# Patient Record
Sex: Female | Born: 1950 | Race: White | Hispanic: No | Smoking: Former smoker
Health system: Southern US, Community
[De-identification: ages and names within clinical notes are randomized; demographics above are authoritative.]

## PROBLEM LIST (undated history)

## (undated) DIAGNOSIS — E119 Type 2 diabetes mellitus without complications: Secondary | ICD-10-CM

## (undated) DIAGNOSIS — M545 Low back pain, unspecified: Secondary | ICD-10-CM

## (undated) DIAGNOSIS — R7303 Prediabetes: Secondary | ICD-10-CM

## (undated) DIAGNOSIS — K219 Gastro-esophageal reflux disease without esophagitis: Secondary | ICD-10-CM

## (undated) DIAGNOSIS — J439 Emphysema, unspecified: Secondary | ICD-10-CM

## (undated) DIAGNOSIS — J189 Pneumonia, unspecified organism: Secondary | ICD-10-CM

## (undated) DIAGNOSIS — M25562 Pain in left knee: Secondary | ICD-10-CM

## (undated) DIAGNOSIS — M810 Age-related osteoporosis without current pathological fracture: Secondary | ICD-10-CM

## (undated) DIAGNOSIS — M1990 Unspecified osteoarthritis, unspecified site: Secondary | ICD-10-CM

## (undated) DIAGNOSIS — Z923 Personal history of irradiation: Secondary | ICD-10-CM

## (undated) DIAGNOSIS — E785 Hyperlipidemia, unspecified: Secondary | ICD-10-CM

## (undated) DIAGNOSIS — F32A Depression, unspecified: Secondary | ICD-10-CM

## (undated) DIAGNOSIS — J449 Chronic obstructive pulmonary disease, unspecified: Secondary | ICD-10-CM

## (undated) DIAGNOSIS — Z8719 Personal history of other diseases of the digestive system: Secondary | ICD-10-CM

## (undated) DIAGNOSIS — F329 Major depressive disorder, single episode, unspecified: Secondary | ICD-10-CM

## (undated) DIAGNOSIS — Z9221 Personal history of antineoplastic chemotherapy: Secondary | ICD-10-CM

## (undated) DIAGNOSIS — G8929 Other chronic pain: Secondary | ICD-10-CM

## (undated) DIAGNOSIS — H9190 Unspecified hearing loss, unspecified ear: Secondary | ICD-10-CM

## (undated) DIAGNOSIS — C801 Malignant (primary) neoplasm, unspecified: Secondary | ICD-10-CM

## (undated) HISTORY — PX: TONSILLECTOMY: SUR1361

## (undated) HISTORY — PX: OTHER SURGICAL HISTORY: SHX169

## (undated) HISTORY — PX: ABDOMINAL HYSTERECTOMY: SHX81

## (undated) HISTORY — PX: DILATION AND CURETTAGE OF UTERUS: SHX78

## (undated) HISTORY — PX: POSTERIOR FUSION LUMBAR SPINE: SUR632

## (undated) HISTORY — PX: EYE SURGERY: SHX253

---
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
## 2000-02-14 HISTORY — PX: BACK SURGERY: SHX140

## 2001-02-13 DIAGNOSIS — C801 Malignant (primary) neoplasm, unspecified: Secondary | ICD-10-CM

## 2001-02-13 DIAGNOSIS — Z9221 Personal history of antineoplastic chemotherapy: Secondary | ICD-10-CM

## 2001-02-13 DIAGNOSIS — Z923 Personal history of irradiation: Secondary | ICD-10-CM

## 2001-02-13 HISTORY — DX: Personal history of irradiation: Z92.3

## 2001-02-13 HISTORY — PX: BREAST EXCISIONAL BIOPSY: SUR124

## 2001-02-13 HISTORY — DX: Malignant (primary) neoplasm, unspecified: C80.1

## 2001-02-13 HISTORY — PX: BREAST LUMPECTOMY: SHX2

## 2001-02-13 HISTORY — DX: Personal history of antineoplastic chemotherapy: Z92.21

## 2003-11-14 ENCOUNTER — Ambulatory Visit: Payer: Self-pay | Admitting: Oncology

## 2003-12-17 ENCOUNTER — Ambulatory Visit: Payer: Self-pay | Admitting: Oncology

## 2004-03-11 ENCOUNTER — Ambulatory Visit: Payer: Self-pay | Admitting: Gastroenterology

## 2004-05-05 ENCOUNTER — Ambulatory Visit: Payer: Self-pay | Admitting: Oncology

## 2004-05-14 ENCOUNTER — Ambulatory Visit: Payer: Self-pay | Admitting: Oncology

## 2004-09-26 ENCOUNTER — Ambulatory Visit: Payer: Self-pay | Admitting: Orthopedic Surgery

## 2004-11-22 ENCOUNTER — Ambulatory Visit: Payer: Self-pay | Admitting: Oncology

## 2004-12-14 ENCOUNTER — Ambulatory Visit: Payer: Self-pay | Admitting: Oncology

## 2005-02-23 ENCOUNTER — Ambulatory Visit: Payer: Self-pay | Admitting: Obstetrics and Gynecology

## 2005-03-10 ENCOUNTER — Ambulatory Visit: Payer: Self-pay | Admitting: Oncology

## 2005-05-24 ENCOUNTER — Ambulatory Visit: Payer: Self-pay | Admitting: Oncology

## 2005-06-13 ENCOUNTER — Ambulatory Visit: Payer: Self-pay | Admitting: Oncology

## 2005-10-08 ENCOUNTER — Emergency Department: Payer: Self-pay | Admitting: Internal Medicine

## 2005-11-20 ENCOUNTER — Ambulatory Visit: Payer: Self-pay | Admitting: Oncology

## 2005-12-14 ENCOUNTER — Ambulatory Visit: Payer: Self-pay | Admitting: Oncology

## 2006-02-26 ENCOUNTER — Ambulatory Visit: Payer: Self-pay | Admitting: Oncology

## 2006-03-29 ENCOUNTER — Ambulatory Visit: Payer: Self-pay | Admitting: Specialist

## 2006-05-15 ENCOUNTER — Ambulatory Visit: Payer: Self-pay | Admitting: Oncology

## 2006-06-04 ENCOUNTER — Ambulatory Visit: Payer: Self-pay | Admitting: Oncology

## 2006-06-14 ENCOUNTER — Ambulatory Visit: Payer: Self-pay | Admitting: Oncology

## 2006-11-14 ENCOUNTER — Ambulatory Visit: Payer: Self-pay | Admitting: Oncology

## 2006-11-19 ENCOUNTER — Ambulatory Visit: Payer: Self-pay | Admitting: Oncology

## 2006-12-15 ENCOUNTER — Ambulatory Visit: Payer: Self-pay | Admitting: Oncology

## 2007-03-01 ENCOUNTER — Ambulatory Visit: Payer: Self-pay | Admitting: Oncology

## 2007-05-15 ENCOUNTER — Ambulatory Visit: Payer: Self-pay | Admitting: Oncology

## 2007-05-27 ENCOUNTER — Ambulatory Visit: Payer: Self-pay | Admitting: Oncology

## 2007-06-14 ENCOUNTER — Ambulatory Visit: Payer: Self-pay | Admitting: Oncology

## 2007-11-14 ENCOUNTER — Ambulatory Visit: Payer: Self-pay | Admitting: Oncology

## 2007-12-11 ENCOUNTER — Ambulatory Visit: Payer: Self-pay | Admitting: Oncology

## 2007-12-15 ENCOUNTER — Ambulatory Visit: Payer: Self-pay | Admitting: Oncology

## 2008-04-23 ENCOUNTER — Ambulatory Visit: Payer: Self-pay | Admitting: Oncology

## 2008-05-14 ENCOUNTER — Ambulatory Visit: Payer: Self-pay | Admitting: Oncology

## 2008-06-08 ENCOUNTER — Ambulatory Visit: Payer: Self-pay | Admitting: Oncology

## 2008-06-13 ENCOUNTER — Ambulatory Visit: Payer: Self-pay | Admitting: Oncology

## 2008-11-13 ENCOUNTER — Ambulatory Visit: Payer: Self-pay | Admitting: Oncology

## 2008-12-01 ENCOUNTER — Ambulatory Visit: Payer: Self-pay | Admitting: Ophthalmology

## 2008-12-07 ENCOUNTER — Ambulatory Visit: Payer: Self-pay | Admitting: Oncology

## 2008-12-14 ENCOUNTER — Ambulatory Visit: Payer: Self-pay | Admitting: Oncology

## 2009-01-26 ENCOUNTER — Ambulatory Visit: Payer: Self-pay | Admitting: Internal Medicine

## 2009-04-28 ENCOUNTER — Ambulatory Visit: Payer: Self-pay | Admitting: Oncology

## 2009-10-31 ENCOUNTER — Emergency Department: Payer: Self-pay | Admitting: Unknown Physician Specialty

## 2010-03-17 ENCOUNTER — Ambulatory Visit: Payer: Self-pay | Admitting: Gastroenterology

## 2010-07-23 ENCOUNTER — Ambulatory Visit: Payer: Self-pay | Admitting: Internal Medicine

## 2010-08-10 ENCOUNTER — Ambulatory Visit: Payer: Self-pay | Admitting: Internal Medicine

## 2011-08-24 ENCOUNTER — Ambulatory Visit: Payer: Self-pay | Admitting: Internal Medicine

## 2011-11-18 ENCOUNTER — Ambulatory Visit: Payer: Self-pay | Admitting: Internal Medicine

## 2011-11-18 LAB — RAPID STREP-A WITH REFLX: Micro Text Report: NEGATIVE

## 2012-08-26 ENCOUNTER — Ambulatory Visit: Payer: Self-pay | Admitting: Internal Medicine

## 2013-10-31 ENCOUNTER — Ambulatory Visit: Payer: Self-pay | Admitting: Internal Medicine

## 2015-08-05 ENCOUNTER — Encounter: Payer: Self-pay | Admitting: *Deleted

## 2015-08-06 ENCOUNTER — Encounter
Admission: RE | Disposition: A | Discharge status: Home / Self Care | Payer: Self-pay | Source: Ambulatory Visit | Attending: Unknown Physician Specialty

## 2015-08-06 ENCOUNTER — Ambulatory Visit
Admission: RE | Admit: 2015-08-06 | Discharge: 2015-08-06 | Disposition: A | Discharge status: Home / Self Care | Payer: Medicare Other | Source: Ambulatory Visit | Attending: Unknown Physician Specialty | Admitting: Unknown Physician Specialty

## 2015-08-06 ENCOUNTER — Ambulatory Visit: Payer: Medicare Other | Admitting: Anesthesiology

## 2015-08-06 DIAGNOSIS — F17210 Nicotine dependence, cigarettes, uncomplicated: Secondary | ICD-10-CM | POA: Diagnosis not present

## 2015-08-06 DIAGNOSIS — F329 Major depressive disorder, single episode, unspecified: Secondary | ICD-10-CM | POA: Insufficient documentation

## 2015-08-06 DIAGNOSIS — K640 First degree hemorrhoids: Secondary | ICD-10-CM | POA: Insufficient documentation

## 2015-08-06 DIAGNOSIS — G8929 Other chronic pain: Secondary | ICD-10-CM | POA: Diagnosis not present

## 2015-08-06 DIAGNOSIS — K2960 Other gastritis without bleeding: Secondary | ICD-10-CM | POA: Diagnosis not present

## 2015-08-06 DIAGNOSIS — Z853 Personal history of malignant neoplasm of breast: Secondary | ICD-10-CM | POA: Diagnosis not present

## 2015-08-06 DIAGNOSIS — Z1211 Encounter for screening for malignant neoplasm of colon: Secondary | ICD-10-CM | POA: Insufficient documentation

## 2015-08-06 DIAGNOSIS — J449 Chronic obstructive pulmonary disease, unspecified: Secondary | ICD-10-CM | POA: Diagnosis not present

## 2015-08-06 DIAGNOSIS — R12 Heartburn: Secondary | ICD-10-CM | POA: Diagnosis present

## 2015-08-06 DIAGNOSIS — D123 Benign neoplasm of transverse colon: Secondary | ICD-10-CM | POA: Diagnosis not present

## 2015-08-06 DIAGNOSIS — Z79899 Other long term (current) drug therapy: Secondary | ICD-10-CM | POA: Diagnosis not present

## 2015-08-06 DIAGNOSIS — K219 Gastro-esophageal reflux disease without esophagitis: Secondary | ICD-10-CM | POA: Insufficient documentation

## 2015-08-06 DIAGNOSIS — Z8371 Family history of colonic polyps: Secondary | ICD-10-CM | POA: Diagnosis not present

## 2015-08-06 DIAGNOSIS — M1990 Unspecified osteoarthritis, unspecified site: Secondary | ICD-10-CM | POA: Diagnosis not present

## 2015-08-06 DIAGNOSIS — K319 Disease of stomach and duodenum, unspecified: Secondary | ICD-10-CM | POA: Diagnosis not present

## 2015-08-06 DIAGNOSIS — M545 Low back pain: Secondary | ICD-10-CM | POA: Insufficient documentation

## 2015-08-06 DIAGNOSIS — E785 Hyperlipidemia, unspecified: Secondary | ICD-10-CM | POA: Insufficient documentation

## 2015-08-06 DIAGNOSIS — E119 Type 2 diabetes mellitus without complications: Secondary | ICD-10-CM | POA: Diagnosis not present

## 2015-08-06 HISTORY — DX: Low back pain, unspecified: M54.50

## 2015-08-06 HISTORY — DX: Pneumonia, unspecified organism: J18.9

## 2015-08-06 HISTORY — DX: Low back pain: M54.5

## 2015-08-06 HISTORY — DX: Unspecified osteoarthritis, unspecified site: M19.90

## 2015-08-06 HISTORY — DX: Pain in left knee: M25.562

## 2015-08-06 HISTORY — DX: Gastro-esophageal reflux disease without esophagitis: K21.9

## 2015-08-06 HISTORY — DX: Malignant (primary) neoplasm, unspecified: C80.1

## 2015-08-06 HISTORY — PX: COLONOSCOPY WITH PROPOFOL: SHX5780

## 2015-08-06 HISTORY — DX: Type 2 diabetes mellitus without complications: E11.9

## 2015-08-06 HISTORY — DX: Hyperlipidemia, unspecified: E78.5

## 2015-08-06 HISTORY — DX: Age-related osteoporosis without current pathological fracture: M81.0

## 2015-08-06 HISTORY — PX: ESOPHAGOGASTRODUODENOSCOPY (EGD) WITH PROPOFOL: SHX5813

## 2015-08-06 HISTORY — DX: Depression, unspecified: F32.A

## 2015-08-06 HISTORY — DX: Major depressive disorder, single episode, unspecified: F32.9

## 2015-08-06 HISTORY — DX: Chronic obstructive pulmonary disease, unspecified: J44.9

## 2015-08-06 HISTORY — DX: Other chronic pain: G89.29

## 2015-08-06 SURGERY — COLONOSCOPY WITH PROPOFOL
Anesthesia: General

## 2015-08-06 MED ORDER — PROPOFOL 10 MG/ML IV BOLUS
INTRAVENOUS | Status: DC | PRN
  Administered 2015-08-06: 35 mg via INTRAVENOUS

## 2015-08-06 MED ORDER — FENTANYL CITRATE (PF) 100 MCG/2ML IJ SOLN
INTRAMUSCULAR | Status: DC | PRN
  Administered 2015-08-06: 50 ug via INTRAVENOUS

## 2015-08-06 MED ORDER — PIPERACILLIN-TAZOBACTAM 3.375 G IVPB
INTRAVENOUS | Status: AC
  Filled 2015-08-06: qty 50

## 2015-08-06 MED ORDER — PROPOFOL 500 MG/50ML IV EMUL
INTRAVENOUS | Status: DC | PRN
  Administered 2015-08-06: 140 ug/kg/min via INTRAVENOUS

## 2015-08-06 MED ORDER — SODIUM CHLORIDE 0.9 % IV SOLN
INTRAVENOUS | Status: DC
  Administered 2015-08-06: 1000 mL via INTRAVENOUS

## 2015-08-06 MED ORDER — PHENYLEPHRINE HCL 10 MG/ML IJ SOLN
INTRAMUSCULAR | Status: DC | PRN
  Administered 2015-08-06: 50 ug via INTRAVENOUS
  Administered 2015-08-06: 150 ug via INTRAVENOUS
  Administered 2015-08-06: 100 ug via INTRAVENOUS

## 2015-08-06 MED ORDER — EPHEDRINE SULFATE 50 MG/ML IJ SOLN
INTRAMUSCULAR | Status: DC | PRN
Start: 2015-08-06 — End: 2015-08-06
  Administered 2015-08-06: 20 mg via INTRAVENOUS
  Administered 2015-08-06: 5 mg via INTRAVENOUS
  Administered 2015-08-06 (×2): 7.5 mg via INTRAVENOUS

## 2015-08-06 MED ORDER — MIDAZOLAM HCL 5 MG/5ML IJ SOLN
INTRAMUSCULAR | Status: DC | PRN
  Administered 2015-08-06: 1 mg via INTRAVENOUS

## 2015-08-06 MED ORDER — SODIUM CHLORIDE 0.9 % IV SOLN: INTRAVENOUS | Status: DC

## 2015-08-06 NOTE — Transfer of Care (Signed)
Immediate Anesthesia Transfer of Care Note  Patient: Terri James  Procedure(s) Performed: Procedure(s): COLONOSCOPY WITH PROPOFOL (N/A) ESOPHAGOGASTRODUODENOSCOPY (EGD) WITH PROPOFOL (N/A)  Patient Location: PACU and Endoscopy Unit  Anesthesia Type:General  Level of Consciousness: awake, alert  and oriented  Airway & Oxygen Therapy: Patient Spontanous Breathing and Patient connected to nasal cannula oxygen  Post-op Assessment: Report given to RN and Post -op Vital signs reviewed and stable  Post vital signs: Reviewed and stable  Last Vitals:  Filed Vitals:   08/06/15 0839 08/06/15 0840  BP:  86/47  Pulse:  89  Temp: 36 C 36 C  Resp:  17    Last Pain: There were no vitals filed for this visit.       Complications: No apparent anesthesia complications

## 2015-08-06 NOTE — Anesthesia Postprocedure Evaluation (Signed)
Anesthesia Post Note  Patient: Terri James  Procedure(s) Performed: Procedure(s) (LRB): COLONOSCOPY WITH PROPOFOL (N/A) ESOPHAGOGASTRODUODENOSCOPY (EGD) WITH PROPOFOL (N/A)  Patient location during evaluation: Endoscopy Anesthesia Type: General Level of consciousness: awake and alert Pain management: pain level controlled Vital Signs Assessment: post-procedure vital signs reviewed and stable Respiratory status: spontaneous breathing and respiratory function stable Cardiovascular status: stable Anesthetic complications: no    Last Vitals:  Filed Vitals:   08/06/15 0900 08/06/15 0910  BP: 96/64 96/61  Pulse: 88 87  Temp:    Resp: 13 11    Last Pain: There were no vitals filed for this visit.               Claude Waldman K

## 2015-08-06 NOTE — Anesthesia Preprocedure Evaluation (Signed)
Anesthesia Evaluation  Patient identified by MRN, date of birth, ID band Patient awake    Reviewed: Allergy & Precautions, NPO status , Patient's Chart, lab work & pertinent test results  History of Anesthesia Complications (+) PONV  Airway Mallampati: I       Dental  (+) Partial Upper   Pulmonary neg pulmonary ROS, COPD,  COPD inhaler, Current Smoker,           Cardiovascular negative cardio ROS       Neuro/Psych Depression negative neurological ROS     GI/Hepatic Neg liver ROS, GERD  Medicated and Poorly Controlled,  Endo/Other  diabetes (pt denies)  Renal/GU negative Renal ROS     Musculoskeletal   Abdominal   Peds  Hematology negative hematology ROS (+)   Anesthesia Other Findings   Reproductive/Obstetrics                             Anesthesia Physical Anesthesia Plan  ASA: II  Anesthesia Plan: General   Post-op Pain Management:    Induction: Intravenous  Airway Management Planned: Nasal Cannula  Additional Equipment:   Intra-op Plan:   Post-operative Plan:   Informed Consent: I have reviewed the patients History and Physical, chart, labs and discussed the procedure including the risks, benefits and alternatives for the proposed anesthesia with the patient or authorized representative who has indicated his/her understanding and acceptance.     Plan Discussed with:   Anesthesia Plan Comments:         Anesthesia Quick Evaluation

## 2015-08-06 NOTE — H&P (Signed)
Primary Care Physician:  Kirk Ruths., MD Primary Gastroenterologist:  Dr. Vira Agar  Pre-Procedure History & Physical: HPI:  Terri James is a 65 y.o. female is here for an endoscopy and colonoscopy.   Past Medical History  Diagnosis Date  . Chronic low back pain     continuous use of opioids  . Depression   . Left knee pain   . COPD (chronic obstructive pulmonary disease) ([facility])   . Osteoporosis   . Hyperlipidemia   . Diabetes mellitus without complication (HCC)     diet controlled  . Arthritis   . Cancer [facility]) 2003    breast  . GERD (gastroesophageal reflux disease)   . Pneumonia     Past Surgical History  Procedure Laterality Date  . Breast lumpectomy    . Posterior fusion lumbar spine      x2  . Tonsillectomy    . Abdominal hysterectomy    . Ganglion cyst removal Left   . Back surgery      Prior to Admission medications   Medication Sig Start Date End Date Taking? Authorizing Provider  acetaminophen (TYLENOL) 325 MG tablet Take 650 mg by mouth every 6 (six) hours as needed.   Yes Historical Provider, MD  albuterol (PROVENTIL HFA;VENTOLIN HFA) 108 (90 Base) MCG/ACT inhaler Inhale into the lungs.   Yes Historical Provider, MD  B Complex-Iron-Minerals (GERIATRIC VITAMIN PO) Take 1 tablet by mouth.   Yes Historical Provider, MD  Calcium Carbonate-Vitamin D 600-125 MG-UNIT TABS Take 1 tablet by mouth daily.   Yes Historical Provider, MD  clonazePAM (KLONOPIN) 0.5 MG tablet Take 0.5 mg by mouth 2 (two) times daily as needed for anxiety.   Yes Historical Provider, MD  cyclobenzaprine (FLEXERIL) 10 MG tablet Take 10 mg by mouth at bedtime as needed for muscle spasms.   Yes Historical Provider, MD  FLUoxetine (PROZAC) 20 MG capsule Take 20 mg by mouth daily.   Yes Historical Provider, MD  furosemide (LASIX) 20 MG tablet Take 20 mg by mouth daily as needed (for edema).   Yes Historical Provider, MD  gabapentin (NEURONTIN) 600 MG tablet Take 600 mg by mouth 4  (four) times daily.   Yes Historical Provider, MD  ibuprofen (ADVIL,MOTRIN) 800 MG tablet Take 800 mg by mouth every 8 (eight) hours as needed.   Yes Historical Provider, MD  morphine (MS CONTIN) 15 MG 12 hr tablet Take 15 mg by mouth every 8 (eight) hours.   Yes Historical Provider, MD  pantoprazole (PROTONIX) 40 MG tablet Take 40 mg by mouth daily.   Yes Historical Provider, MD  propranolol ER (INDERAL LA) 120 MG 24 hr capsule Take 120 mg by mouth daily.   Yes Historical Provider, MD  simvastatin (ZOCOR) 40 MG tablet Take 40 mg by mouth daily.   Yes Historical Provider, MD  varenicline (CHANTIX PAK) 0.5 MG X 11 & 1 MG X 42 tablet Take by mouth 2 (two) times daily. Take one 0.5 mg tablet by mouth once daily for 3 days, then increase to one 0.5 mg tablet twice daily for 4 days, then increase to one 1 mg tablet twice daily.   Yes Historical Provider, MD  varenicline (CHANTIX) 1 MG tablet Take 1 mg by mouth 2 (two) times daily.   Yes Historical Provider, MD    Allergies as of 07/19/2015  . (Not on File)    History reviewed. No pertinent family history.  Social History   Social History  . Marital Status: Widowed  Spouse Name: N/A  . Number of Children: N/A  . Years of Education: N/A   Occupational History  . Not on file.   Social History Main Topics  . Smoking status: Current Every Day Smoker -- 0.25 packs/day for 42 years    Types: Cigarettes  . Smokeless tobacco: Never Used  . Alcohol Use: Yes  . Drug Use: No  . Sexual Activity: No   Other Topics Concern  . Not on file   Social History Narrative    Review of Systems: See HPI, otherwise negative ROS  Physical Exam: BP 111/70 mmHg  Pulse 100  Temp(Src) 97.9 F (36.6 C) (Tympanic)  Resp 17  Ht 5\' 5"  (1.651 m)  Wt 70.308 kg (155 lb)  BMI 25.79 kg/m2  SpO2 98% General:   Alert,  pleasant and cooperative in NAD Head:  Normocephalic and atraumatic. Neck:  Supple; no masses or thyromegaly. Lungs:  Clear throughout to  auscultation.    Heart:  Regular rate and rhythm. Abdomen:  Soft, nontender and nondistended. Normal bowel sounds, without guarding, and without rebound.   Neurologic:  Alert and  oriented x4;  grossly normal neurologically.  Impression/Plan: [name] is here for an endoscopy and colonoscopy to be performed for screening colonoscopy and GERD  Risks, benefits, limitations, and alternatives regarding  endoscopy and colonoscopy have been reviewed with the patient.  Questions have been answered.  All parties agreeable.   Gaylyn Cheers, MD  08/06/2015, 7:32 AM

## 2015-08-06 NOTE — Op Note (Signed)
[facility] Gastroenterology Patient Name: Terri James Procedure Date: 08/06/2015 7:57 AM MRN: [id number] Account #: [id number] Date of Birth: Feb 19, 1950 Admit Type: Outpatient Age: 65 Room: [facility] ENDO ROOM 1 Gender: Female Note Status: Finalized Procedure:            Upper GI endoscopy Indications:          Heartburn Providers:            Manya Silvas, MD Referring MD:         Ocie Cornfield. Ouida Sills MD, MD (Referring MD) Medicines:            Propofol per Anesthesia Complications:        No immediate complications. Procedure:            Pre-Anesthesia Assessment:                       - After reviewing the risks and benefits, the patient                        was deemed in satisfactory condition to undergo the                        procedure.                       - Using IV propofol under the supervision of a CRNA was                        determined to be medically necessary for this procedure                        based on review of the patient's medical history,                        medications, and prior anesthesia history.                       - After reviewing the risks and benefits, the patient                        was deemed in satisfactory condition to undergo the                        procedure.                       After obtaining informed consent, the endoscope was                        passed under direct vision. Throughout the procedure,                        the patient's blood pressure, pulse, and oxygen                        saturations were monitored continuously. The Endoscope                        was introduced through the mouth, and advanced to the  second part of duodenum. The upper GI endoscopy was                        accomplished without difficulty. The patient tolerated                        the procedure well. Findings:      The examined esophagus was normal. Biopsies were taken with a  cold       forceps for histology done at [facility].      Striped mildly erythematous mucosa without bleeding was found in the       gastric antrum. Biopsies were taken with a cold forceps for histology.       Biopsies were taken with a cold forceps for Helicobacter pylori testing.       Biopsy done of body also.      The examined duodenum was normal. Impression:           - Normal esophagus. Biopsied.                       - Erythematous mucosa in the antrum. Biopsied.                       - Normal examined duodenum. Recommendation:       - Await pathology results.                       - Perform a colonoscopy as previously scheduled. Manya Silvas, MD 08/06/2015 8:10:42 AM This report has been signed electronically. Number of Addenda: 0 Note Initiated On: 08/06/2015 7:57 AM      [facility]

## 2015-08-06 NOTE — Op Note (Signed)
[facility] Gastroenterology Patient Name: Terri James Procedure Date: 08/06/2015 7:56 AM MRN: [id number] Account #: [id number] Date of Birth: 23-Mar-1950 Admit Type: Outpatient Age: 65 Room: [facility] ENDO ROOM 1 Gender: Female Note Status: Finalized Procedure:            Colonoscopy Indications:          Colon cancer screening in patient at increased risk:                        Family history of 1st-degree relative with colon polyps Providers:            Manya Silvas, MD Referring MD:         Ocie Cornfield. Ouida Sills MD, MD (Referring MD) Medicines:            Propofol per Anesthesia Complications:        No immediate complications. Procedure:            Pre-Anesthesia Assessment:                       - After reviewing the risks and benefits, the patient                        was deemed in satisfactory condition to undergo the                        procedure.                       After obtaining informed consent, the colonoscope was                        passed under direct vision. Throughout the procedure,                        the patient's blood pressure, pulse, and oxygen                        saturations were monitored continuously. The                        Colonoscope was introduced through the anus and                        advanced to the the cecum, identified by appendiceal                        orifice and ileocecal valve. The colonoscopy was                        performed without difficulty. The patient tolerated the                        procedure well. The quality of the bowel preparation                        was adequate to identify polyps 6 mm and larger in size. Findings:      A diminutive polyp was found in the transverse colon. The polyp was       sessile. The polyp was removed with a hot snare. Resection and retrieval  were complete.      Internal hemorrhoids were found during endoscopy. The hemorrhoids were       small  and Grade I (internal hemorrhoids that do not prolapse).      The exam was otherwise without abnormality.      The prep was fair at best requiring significant lavage, some small areas       could not be seen well. Impression:           - One diminutive polyp in the transverse colon, removed                        with a hot snare. Resected and retrieved.                       - Internal hemorrhoids.                       - The examination was otherwise normal. Recommendation:       - Await pathology results. Manya Silvas, MD 08/06/2015 8:39:50 AM This report has been signed electronically. Number of Addenda: 0 Note Initiated On: 08/06/2015 7:56 AM Scope Withdrawal Time: 0 hours 13 minutes 23 seconds  Total Procedure Duration: 0 hours 22 minutes 10 seconds       [facility]

## 2015-08-09 ENCOUNTER — Encounter: Payer: Self-pay | Admitting: Unknown Physician Specialty

## 2015-08-09 LAB — SURGICAL PATHOLOGY

## 2015-08-19 ENCOUNTER — Encounter: Payer: Self-pay | Admitting: *Deleted

## 2015-08-24 ENCOUNTER — Encounter: Payer: Self-pay | Admitting: Anesthesiology

## 2015-08-24 ENCOUNTER — Ambulatory Visit: Payer: Medicare Other | Admitting: Anesthesiology

## 2015-08-24 ENCOUNTER — Encounter
Admission: RE | Disposition: A | Discharge status: Home / Self Care | Payer: Self-pay | Source: Ambulatory Visit | Attending: Ophthalmology

## 2015-08-24 ENCOUNTER — Ambulatory Visit
Admission: RE | Admit: 2015-08-24 | Discharge: 2015-08-24 | Disposition: A | Discharge status: Home / Self Care | Payer: Medicare Other | Source: Ambulatory Visit | Attending: Ophthalmology | Admitting: Ophthalmology

## 2015-08-24 DIAGNOSIS — E7800 Pure hypercholesterolemia, unspecified: Secondary | ICD-10-CM | POA: Diagnosis not present

## 2015-08-24 DIAGNOSIS — J449 Chronic obstructive pulmonary disease, unspecified: Secondary | ICD-10-CM | POA: Diagnosis not present

## 2015-08-24 DIAGNOSIS — M545 Low back pain: Secondary | ICD-10-CM | POA: Insufficient documentation

## 2015-08-24 DIAGNOSIS — R062 Wheezing: Secondary | ICD-10-CM | POA: Diagnosis not present

## 2015-08-24 DIAGNOSIS — F329 Major depressive disorder, single episode, unspecified: Secondary | ICD-10-CM | POA: Diagnosis not present

## 2015-08-24 DIAGNOSIS — K219 Gastro-esophageal reflux disease without esophagitis: Secondary | ICD-10-CM | POA: Insufficient documentation

## 2015-08-24 DIAGNOSIS — K449 Diaphragmatic hernia without obstruction or gangrene: Secondary | ICD-10-CM | POA: Insufficient documentation

## 2015-08-24 DIAGNOSIS — Z853 Personal history of malignant neoplasm of breast: Secondary | ICD-10-CM | POA: Diagnosis not present

## 2015-08-24 DIAGNOSIS — F17200 Nicotine dependence, unspecified, uncomplicated: Secondary | ICD-10-CM | POA: Insufficient documentation

## 2015-08-24 DIAGNOSIS — G8929 Other chronic pain: Secondary | ICD-10-CM | POA: Diagnosis not present

## 2015-08-24 DIAGNOSIS — H9190 Unspecified hearing loss, unspecified ear: Secondary | ICD-10-CM | POA: Insufficient documentation

## 2015-08-24 DIAGNOSIS — H2511 Age-related nuclear cataract, right eye: Secondary | ICD-10-CM | POA: Insufficient documentation

## 2015-08-24 HISTORY — DX: Unspecified hearing loss, unspecified ear: H91.90

## 2015-08-24 HISTORY — DX: Personal history of other diseases of the digestive system: Z87.19

## 2015-08-24 HISTORY — PX: CATARACT EXTRACTION W/PHACO: SHX586

## 2015-08-24 LAB — GLUCOSE, CAPILLARY: GLUCOSE-CAPILLARY: 128 mg/dL — AB (ref 65–99)

## 2015-08-24 SURGERY — PHACOEMULSIFICATION, CATARACT, WITH IOL INSERTION
Anesthesia: Monitor Anesthesia Care | Site: Eye | Laterality: Right | Wound class: Clean

## 2015-08-24 MED ORDER — MOXIFLOXACIN HCL 0.5 % OP SOLN
OPHTHALMIC | Status: DC | PRN
  Administered 2015-08-24: 1 [drp] via OPHTHALMIC

## 2015-08-24 MED ORDER — POVIDONE-IODINE 5 % OP SOLN
OPHTHALMIC | Status: AC
  Administered 2015-08-24: 1 via OPHTHALMIC
  Filled 2015-08-24: qty 30

## 2015-08-24 MED ORDER — SODIUM CHLORIDE 0.9 % IV SOLN
INTRAVENOUS | Status: DC
  Administered 2015-08-24: 10:00:00 via INTRAVENOUS
  Administered 2015-08-24: 50 mL/h via INTRAVENOUS

## 2015-08-24 MED ORDER — CARBACHOL 0.01 % IO SOLN
INTRAOCULAR | Status: DC | PRN
  Administered 2015-08-24: 0.5 mL via INTRAOCULAR

## 2015-08-24 MED ORDER — NA CHONDROIT SULF-NA HYALURON 40-17 MG/ML IO SOLN
INTRAOCULAR | Status: AC
  Filled 2015-08-24: qty 1

## 2015-08-24 MED ORDER — CEFUROXIME OPHTHALMIC INJECTION 1 MG/0.1 ML
INJECTION | OPHTHALMIC | Status: AC
  Filled 2015-08-24: qty 0.1

## 2015-08-24 MED ORDER — TETRACAINE HCL 0.5 % OP SOLN
1.0000 [drp] | Freq: Once | OPHTHALMIC | Status: AC
  Administered 2015-08-24: 1 [drp] via OPHTHALMIC

## 2015-08-24 MED ORDER — NA CHONDROIT SULF-NA HYALURON 40-17 MG/ML IO SOLN
INTRAOCULAR | Status: DC | PRN
  Administered 2015-08-24: 1 mL via INTRAOCULAR

## 2015-08-24 MED ORDER — MOXIFLOXACIN HCL 0.5 % OP SOLN
OPHTHALMIC | Status: AC
  Filled 2015-08-24: qty 3

## 2015-08-24 MED ORDER — CEFUROXIME OPHTHALMIC INJECTION 1 MG/0.1 ML
INJECTION | OPHTHALMIC | Status: DC | PRN
  Administered 2015-08-24: 0.1 mL via INTRACAMERAL

## 2015-08-24 MED ORDER — FENTANYL CITRATE (PF) 100 MCG/2ML IJ SOLN
INTRAMUSCULAR | Status: DC | PRN
  Administered 2015-08-24: 50 ug via INTRAVENOUS

## 2015-08-24 MED ORDER — MIDAZOLAM HCL 2 MG/2ML IJ SOLN
INTRAMUSCULAR | Status: DC | PRN
  Administered 2015-08-24 (×2): 1 mg via INTRAVENOUS

## 2015-08-24 MED ORDER — POVIDONE-IODINE 5 % OP SOLN
1.0000 "application " | Freq: Once | OPHTHALMIC | Status: AC
  Administered 2015-08-24: 1 via OPHTHALMIC

## 2015-08-24 MED ORDER — MOXIFLOXACIN HCL 0.5 % OP SOLN: 1.0000 [drp] | OPHTHALMIC | Status: DC | PRN

## 2015-08-24 MED ORDER — ARMC OPHTHALMIC DILATING GEL
1.0000 "application " | OPHTHALMIC | Status: AC | PRN
  Administered 2015-08-24 (×2): 1 via OPHTHALMIC

## 2015-08-24 MED ORDER — BSS IO SOLN
INTRAOCULAR | Status: DC | PRN
  Administered 2015-08-24: 10:00:00 via OPHTHALMIC

## 2015-08-24 MED ORDER — EPINEPHRINE HCL 1 MG/ML IJ SOLN
INTRAMUSCULAR | Status: AC
  Filled 2015-08-24: qty 1

## 2015-08-24 SURGICAL SUPPLY — 21 items
CANNULA ANT/CHMB 27GA (MISCELLANEOUS) ×2 IMPLANT
CUP MEDICINE 2OZ PLAST GRAD ST (MISCELLANEOUS) ×2 IMPLANT
GLOVE BIO SURGEON STRL SZ8 (GLOVE) ×2 IMPLANT
GLOVE BIOGEL M 6.5 STRL (GLOVE) ×2 IMPLANT
GLOVE SURG LX 8.0 MICRO (GLOVE) ×1
GLOVE SURG LX STRL 8.0 MICRO (GLOVE) ×1 IMPLANT
GOWN STRL REUS W/ TWL LRG LVL3 (GOWN DISPOSABLE) ×2 IMPLANT
GOWN STRL REUS W/TWL LRG LVL3 (GOWN DISPOSABLE) ×2
LENS IOL TECNIS ITEC 18.5 (Intraocular Lens) ×2 IMPLANT
PACK CATARACT (MISCELLANEOUS) ×2 IMPLANT
PACK CATARACT BRASINGTON LX (MISCELLANEOUS) ×2 IMPLANT
PACK EYE AFTER SURG (MISCELLANEOUS) ×2 IMPLANT
SOL BSS BAG (MISCELLANEOUS) ×2
SOL PREP PVP 2OZ (MISCELLANEOUS) ×2
SOLUTION BSS BAG (MISCELLANEOUS) ×1 IMPLANT
SOLUTION PREP PVP 2OZ (MISCELLANEOUS) ×1 IMPLANT
SYR 3ML LL SCALE MARK (SYRINGE) ×2 IMPLANT
SYR 5ML LL (SYRINGE) ×2 IMPLANT
SYR TB 1ML 27GX1/2 LL (SYRINGE) ×2 IMPLANT
WATER STERILE IRR 1000ML POUR (IV SOLUTION) ×2 IMPLANT
WIPE NON LINTING 3.25X3.25 (MISCELLANEOUS) ×2 IMPLANT

## 2015-08-24 NOTE — Discharge Instructions (Signed)
Eye Surgery Discharge Instructions  Expect mild scratchy sensation or mild soreness. DO NOT RUB YOUR EYE!  The day of surgery:  Minimal physical activity, but bed rest is not required  No reading, computer work, or close hand work  No bending, lifting, or straining.  May watch TV  For 24 hours:  No driving, legal decisions, or alcoholic beverages  Safety precautions  Eat anything you prefer: It is better to start with liquids, then soup then solid foods.  _____ Eye patch should be worn until postoperative exam tomorrow.  ____ Solar shield eyeglasses should be worn for comfort in the sunlight/patch while sleeping  Resume all regular medications including aspirin or Coumadin if these were discontinued prior to surgery. You may shower, bathe, shave, or wash your hair. Tylenol may be taken for mild discomfort.  Call your doctor if you experience significant pain, nausea, or vomiting, fever > 101 or other signs of infection. [phone] or [phone] Specific instructions:  Follow-up Information    Follow up with Tim Lair, MD On 08/25/2015.   Specialty:  Ophthalmology   Why:  8:50   Contact information:   [address] [place] [address] [phone]

## 2015-08-24 NOTE — Anesthesia Preprocedure Evaluation (Addendum)
Anesthesia Evaluation  Patient identified by MRN, date of birth, ID band Patient awake    Reviewed: Allergy & Precautions, NPO status , Patient's Chart, lab work & pertinent test results  History of Anesthesia Complications (+) PONV  Airway Mallampati: I       Dental  (+) Partial Upper   Pulmonary neg pulmonary ROS, pneumonia, resolved, COPD,  COPD inhaler, Current Smoker,           Cardiovascular negative cardio ROS       Neuro/Psych PSYCHIATRIC DISORDERS Depression negative neurological ROS     GI/Hepatic Neg liver ROS, hiatal hernia, GERD  Medicated and Controlled,  Endo/Other  diabetes, Well Controlled, Type 2  Renal/GU negative Renal ROS  negative genitourinary   Musculoskeletal  (+) Arthritis , Osteoarthritis,  Chronic low back pain   Abdominal   Peds negative pediatric ROS (+)  Hematology negative hematology ROS (+)   Anesthesia Other Findings COPD Breast CA Hx DM Depression  Reproductive/Obstetrics                            Anesthesia Physical  Anesthesia Plan  ASA: II  Anesthesia Plan: MAC   Post-op Pain Management:    Induction: Intravenous  Airway Management Planned: Nasal Cannula  Additional Equipment:   Intra-op Plan:   Post-operative Plan:   Informed Consent: I have reviewed the patients History and Physical, chart, labs and discussed the procedure including the risks, benefits and alternatives for the proposed anesthesia with the patient or authorized representative who has indicated his/her understanding and acceptance.   Dental advisory given  Plan Discussed with: CRNA and Surgeon  Anesthesia Plan Comments:         Anesthesia Quick Evaluation

## 2015-08-24 NOTE — H&P (Signed)
  All labs reviewed. Abnormal studies sent to patients PCP when indicated.  Previous H&P reviewed, patient examined, there are NO CHANGES.  Terri James LOUIS7/11/20179:51 AM

## 2015-08-24 NOTE — Transfer of Care (Signed)
Immediate Anesthesia Transfer of Care Note  Patient: Terri James  Procedure(s) Performed: Procedure(s) with comments: CATARACT EXTRACTION PHACO AND INTRAOCULAR LENS PLACEMENT (IOC) (Right) - [place] 1.02 AP% 18.2 CDE 11.28 Fluid Pack lot # [id number]  Patient Location: PACU  Anesthesia Type:MAC  Level of Consciousness: awake, alert  and oriented  Airway & Oxygen Therapy: Patient Spontanous Breathing  Post-op Assessment: Report given to RN and Post -op Vital signs reviewed and stable  Post vital signs: Reviewed and stable  Last Vitals:  Filed Vitals:   08/24/15 0915  BP: 115/67  Pulse: 95  Temp: 36.8 C  Resp: 16    Last Pain:  Filed Vitals:   08/24/15 0931  PainSc: 0-No pain      Patients Stated Pain Goal: 0 ([id number])  Complications: No apparent anesthesia complications

## 2015-08-24 NOTE — Op Note (Signed)
PREOPERATIVE DIAGNOSIS:  Nuclear sclerotic cataract of the right eye.   POSTOPERATIVE DIAGNOSIS: NUCLEAR SCLEROTIC CATARACT RIGHT EYE   OPERATIVE PROCEDURE:  Procedure(s): CATARACT EXTRACTION PHACO AND INTRAOCULAR LENS PLACEMENT (IOC)   SURGEON:  Birder Robson, MD.   ANESTHESIA:  Anesthesiologist: Alvin Critchley, MD CRNA: Jenetta Downer, CRNA  1.      Managed anesthesia care. 2.      Topical tetracaine drops followed by 2% Xylocaine jelly applied in the preoperative holding area.   COMPLICATIONS:  None.   TECHNIQUE:   Stop and chop   DESCRIPTION OF PROCEDURE:  The patient was examined and consented in the preoperative holding area where the aforementioned topical anesthesia was applied to the right eye and then brought back to the Operating Room where the right eye was prepped and draped in the usual sterile ophthalmic fashion and a lid speculum was placed. A paracentesis was created with the side port blade and the anterior chamber was filled with viscoelastic. A near clear corneal incision was performed with the steel keratome. A continuous curvilinear capsulorrhexis was performed with a cystotome followed by the capsulorrhexis forceps. Hydrodissection and hydrodelineation were carried out with BSS on a blunt cannula. The lens was removed in a stop and chop  technique and the remaining cortical material was removed with the irrigation-aspiration handpiece. The capsular bag was inflated with viscoelastic and the Technis [id number]  lens was placed in the capsular bag without complication. The remaining viscoelastic was removed from the eye with the irrigation-aspiration handpiece. The wounds were hydrated. The anterior chamber was flushed with Miostat and the eye was inflated to physiologic pressure. 0.1 mL of cefuroxime concentration 10 mg/mL was placed in the anterior chamber. The wounds were found to be water tight. The eye was dressed with Vigamox. The patient was given protective  glasses to wear throughout the day and a shield with which to sleep tonight. The patient was also given drops with which to begin a drop regimen today and will follow-up with me in one day.  Implant Name Type Inv. Item Serial No. Manufacturer Lot No. LRB No. Used  LENS IOL DIOP 18.5 - [id number] Intraocular Lens LENS IOL DIOP 18.5 [id number]   Right 1   Procedure(s) with comments: CATARACT EXTRACTION PHACO AND INTRAOCULAR LENS PLACEMENT (IOC) (Right) - [place] 1.02 AP% 18.2 CDE 11.28 Fluid Pack lot # [id number]  Electronically signed: Bruno 08/24/2015 10:19 AM

## 2015-08-25 NOTE — Anesthesia Postprocedure Evaluation (Signed)
Anesthesia Post Note  Patient: Terri James  Procedure(s) Performed: Procedure(s) (LRB): CATARACT EXTRACTION PHACO AND INTRAOCULAR LENS PLACEMENT (IOC) (Right)  Patient location during evaluation: PACU Anesthesia Type: MAC Level of consciousness: awake and alert and oriented Pain management: pain level controlled Vital Signs Assessment: post-procedure vital signs reviewed and stable Respiratory status: spontaneous breathing Cardiovascular status: blood pressure returned to baseline Anesthetic complications: no    Last Vitals:  Filed Vitals:   08/24/15 1018 08/24/15 1033  BP: 102/63 103/65  Pulse: 92 91  Temp: 36.6 C   Resp: 16 16    Last Pain:  Filed Vitals:   08/25/15 0820  PainSc: 0-No pain                 Corde Antonini

## 2016-05-09 ENCOUNTER — Encounter
Admission: RE | Disposition: A | Discharge status: Home / Self Care | Payer: Self-pay | Source: Ambulatory Visit | Attending: Ophthalmology

## 2016-05-09 ENCOUNTER — Encounter: Payer: Self-pay | Admitting: *Deleted

## 2016-05-09 ENCOUNTER — Ambulatory Visit
Admission: RE | Admit: 2016-05-09 | Discharge: 2016-05-09 | Disposition: A | Discharge status: Home / Self Care | Payer: Medicare Other | Source: Ambulatory Visit | Attending: Ophthalmology | Admitting: Ophthalmology

## 2016-05-09 ENCOUNTER — Ambulatory Visit: Payer: Medicare Other | Admitting: Certified Registered Nurse Anesthetist

## 2016-05-09 DIAGNOSIS — J449 Chronic obstructive pulmonary disease, unspecified: Secondary | ICD-10-CM | POA: Diagnosis not present

## 2016-05-09 DIAGNOSIS — K219 Gastro-esophageal reflux disease without esophagitis: Secondary | ICD-10-CM | POA: Diagnosis not present

## 2016-05-09 DIAGNOSIS — F17200 Nicotine dependence, unspecified, uncomplicated: Secondary | ICD-10-CM | POA: Diagnosis not present

## 2016-05-09 DIAGNOSIS — K449 Diaphragmatic hernia without obstruction or gangrene: Secondary | ICD-10-CM | POA: Insufficient documentation

## 2016-05-09 DIAGNOSIS — I10 Essential (primary) hypertension: Secondary | ICD-10-CM | POA: Diagnosis not present

## 2016-05-09 DIAGNOSIS — E1136 Type 2 diabetes mellitus with diabetic cataract: Secondary | ICD-10-CM | POA: Insufficient documentation

## 2016-05-09 HISTORY — PX: CATARACT EXTRACTION W/PHACO: SHX586

## 2016-05-09 SURGERY — PHACOEMULSIFICATION, CATARACT, WITH IOL INSERTION
Anesthesia: Monitor Anesthesia Care | Site: Eye | Laterality: Left | Wound class: Clean

## 2016-05-09 MED ORDER — EPINEPHRINE PF 1 MG/ML IJ SOLN
INTRAMUSCULAR | Status: AC
  Filled 2016-05-09: qty 2

## 2016-05-09 MED ORDER — MOXIFLOXACIN HCL 0.5 % OP SOLN
OPHTHALMIC | Status: AC
  Filled 2016-05-09: qty 3

## 2016-05-09 MED ORDER — POVIDONE-IODINE 5 % OP SOLN
OPHTHALMIC | Status: AC
  Filled 2016-05-09: qty 30

## 2016-05-09 MED ORDER — LIDOCAINE HCL (PF) 4 % IJ SOLN
INTRAOCULAR | Status: DC | PRN
  Administered 2016-05-09: 2 mL via OPHTHALMIC

## 2016-05-09 MED ORDER — FENTANYL CITRATE (PF) 100 MCG/2ML IJ SOLN
INTRAMUSCULAR | Status: AC
Start: 2016-05-09 — End: 2016-05-09
  Filled 2016-05-09: qty 2

## 2016-05-09 MED ORDER — MIDAZOLAM HCL 2 MG/2ML IJ SOLN
INTRAMUSCULAR | Status: AC
  Filled 2016-05-09: qty 2

## 2016-05-09 MED ORDER — ARMC OPHTHALMIC DILATING DROPS: 1.0000 "application " | OPHTHALMIC | Status: AC

## 2016-05-09 MED ORDER — MOXIFLOXACIN HCL 0.5 % OP SOLN: 1.0000 [drp] | OPHTHALMIC | Status: DC | PRN

## 2016-05-09 MED ORDER — CARBACHOL 0.01 % IO SOLN
INTRAOCULAR | Status: DC | PRN
  Administered 2016-05-09: .5 mL via INTRAOCULAR

## 2016-05-09 MED ORDER — NA CHONDROIT SULF-NA HYALURON 40-17 MG/ML IO SOLN
INTRAOCULAR | Status: AC
  Filled 2016-05-09: qty 1

## 2016-05-09 MED ORDER — SODIUM CHLORIDE 0.9 % IV SOLN
INTRAVENOUS | Status: DC
  Administered 2016-05-09: 09:00:00 via INTRAVENOUS

## 2016-05-09 MED ORDER — NA CHONDROIT SULF-NA HYALURON 40-17 MG/ML IO SOLN
INTRAOCULAR | Status: DC | PRN
  Administered 2016-05-09: 1 mL via INTRAOCULAR

## 2016-05-09 MED ORDER — ARMC OPHTHALMIC DILATING DROPS
1.0000 "application " | OPHTHALMIC | Status: AC
  Administered 2016-05-09 (×3): 1 via OPHTHALMIC

## 2016-05-09 MED ORDER — ARMC OPHTHALMIC DILATING DROPS
OPHTHALMIC | Status: AC
  Administered 2016-05-09: 1 via OPHTHALMIC
  Filled 2016-05-09: qty 0.4

## 2016-05-09 MED ORDER — FENTANYL CITRATE (PF) 100 MCG/2ML IJ SOLN
INTRAMUSCULAR | Status: AC
  Filled 2016-05-09: qty 2

## 2016-05-09 MED ORDER — MOXIFLOXACIN HCL 0.5 % OP SOLN
OPHTHALMIC | Status: DC | PRN
  Administered 2016-05-09: .2 mL via OPHTHALMIC

## 2016-05-09 MED ORDER — FENTANYL CITRATE (PF) 100 MCG/2ML IJ SOLN
INTRAMUSCULAR | Status: DC | PRN
  Administered 2016-05-09: 25 ug via INTRAVENOUS

## 2016-05-09 MED ORDER — EPINEPHRINE PF 1 MG/ML IJ SOLN
INTRAOCULAR | Status: DC | PRN
  Administered 2016-05-09: 1 mL via OPHTHALMIC

## 2016-05-09 MED ORDER — MIDAZOLAM HCL 2 MG/2ML IJ SOLN
INTRAMUSCULAR | Status: DC | PRN
  Administered 2016-05-09: 1 mg via INTRAVENOUS

## 2016-05-09 SURGICAL SUPPLY — 21 items
CANNULA ANT/CHMB 27GA (MISCELLANEOUS) ×3 IMPLANT
CUP MEDICINE 2OZ PLAST GRAD ST (MISCELLANEOUS) ×3 IMPLANT
GLOVE BIO SURGEON STRL SZ8 (GLOVE) ×3 IMPLANT
GLOVE BIOGEL M 6.5 STRL (GLOVE) ×3 IMPLANT
GLOVE SURG LX 8.0 MICRO (GLOVE) ×2
GLOVE SURG LX STRL 8.0 MICRO (GLOVE) ×1 IMPLANT
GOWN STRL REUS W/ TWL LRG LVL3 (GOWN DISPOSABLE) ×2 IMPLANT
GOWN STRL REUS W/TWL LRG LVL3 (GOWN DISPOSABLE) ×4
LENS IOL TECNIS ITEC 19.5 (Intraocular Lens) ×3 IMPLANT
PACK CATARACT (MISCELLANEOUS) ×3 IMPLANT
PACK CATARACT BRASINGTON LX (MISCELLANEOUS) ×3 IMPLANT
PACK EYE AFTER SURG (MISCELLANEOUS) ×3 IMPLANT
SOL BSS BAG (MISCELLANEOUS) ×3
SOL PREP PVP 2OZ (MISCELLANEOUS) ×3
SOLUTION BSS BAG (MISCELLANEOUS) ×1 IMPLANT
SOLUTION PREP PVP 2OZ (MISCELLANEOUS) ×1 IMPLANT
SYR 3ML LL SCALE MARK (SYRINGE) ×3 IMPLANT
SYR 5ML LL (SYRINGE) ×3 IMPLANT
SYR TB 1ML 27GX1/2 LL (SYRINGE) ×3 IMPLANT
WATER STERILE IRR 250ML POUR (IV SOLUTION) ×3 IMPLANT
WIPE NON LINTING 3.25X3.25 (MISCELLANEOUS) ×3 IMPLANT

## 2016-05-09 NOTE — Discharge Instructions (Signed)
Eye Surgery Discharge Instructions  Expect mild scratchy sensation or mild soreness. DO NOT RUB YOUR EYE!  The day of surgery:  Minimal physical activity, but bed rest is not required  No reading, computer work, or close hand work  No bending, lifting, or straining.  May watch TV  For 24 hours:  No driving, legal decisions, or alcoholic beverages  Safety precautions  Eat anything you prefer: It is better to start with liquids, then soup then solid foods.  _____ Eye patch should be worn until postoperative exam tomorrow.  ____ Solar shield eyeglasses should be worn for comfort in the sunlight/patch while sleeping  Resume all regular medications including aspirin or Coumadin if these were discontinued prior to surgery. You may shower, bathe, shave, or wash your hair. Tylenol may be taken for mild discomfort.  Call your doctor if you experience significant pain, nausea, or vomiting, fever > 101 or other signs of infection. [phone] or [phone] Specific instructions:  Follow-up Information    PORFILIO,WILLIAM LOUIS, MD Follow up on 05/10/2016.   Specialty:  Ophthalmology Why:  10:45 Contact information: [address] [place] [address] [phone]

## 2016-05-09 NOTE — Anesthesia Procedure Notes (Signed)
Procedure Name: MAC Date/Time: 05/09/2016 9:21 AM Performed by: Darlyne Russian Pre-anesthesia Checklist: Patient identified, Emergency Drugs available, Suction available, Patient being monitored and Timeout performed Patient Re-evaluated:Patient Re-evaluated prior to inductionOxygen Delivery Method: Nasal cannula Placement Confirmation: positive ETCO2

## 2016-05-09 NOTE — Op Note (Signed)
PREOPERATIVE DIAGNOSIS:  Nuclear sclerotic cataract of the left eye.   POSTOPERATIVE DIAGNOSIS:  Nuclear sclerotic cataract of the left eye.   OPERATIVE PROCEDURE: Procedure(s): CATARACT EXTRACTION PHACO AND INTRAOCULAR LENS PLACEMENT (IOC)   SURGEON:  Birder Robson, MD.   ANESTHESIA:  Anesthesiologist: Molli Barrows, MD CRNA: Darlyne Russian, CRNA  1.      Managed anesthesia care. 2.     0.110ml of Shugarcaine was instilled following the paracentesis   COMPLICATIONS:  None.   TECHNIQUE:   Stop and chop   DESCRIPTION OF PROCEDURE:  The patient was examined and consented in the preoperative holding area where the aforementioned topical anesthesia was applied to the left eye and then brought back to the Operating Room where the left eye was prepped and draped in the usual sterile ophthalmic fashion and a lid speculum was placed. A paracentesis was created with the side port blade and the anterior chamber was filled with viscoelastic. A near clear corneal incision was performed with the steel keratome. A continuous curvilinear capsulorrhexis was performed with a cystotome followed by the capsulorrhexis forceps. Hydrodissection and hydrodelineation were carried out with BSS on a blunt cannula. The lens was removed in a stop and chop  technique and the remaining cortical material was removed with the irrigation-aspiration handpiece. The capsular bag was inflated with viscoelastic and the Technis [id number] lens was placed in the capsular bag without complication. The remaining viscoelastic was removed from the eye with the irrigation-aspiration handpiece. The wounds were hydrated. The anterior chamber was flushed with Miostat and the eye was inflated to physiologic pressure. 0.46ml Vigamox was placed in the anterior chamber. The wounds were found to be water tight. The eye was dressed with Vigamox. The patient was given protective glasses to wear throughout the day and a shield with which to sleep tonight.  The patient was also given drops with which to begin a drop regimen today and will follow-up with me in one day.  Implant Name Type Inv. Item Serial No. Manufacturer Lot No. LRB No. Used  LENS IOL DIOP 19.5 - [id number] Intraocular Lens LENS IOL DIOP 19.5 [id number]   Left 1    Procedure(s) with comments: CATARACT EXTRACTION PHACO AND INTRAOCULAR LENS PLACEMENT (IOC) (Left) - [place] 00:49 AP% 17.2 CDE 8.45 Fluid pack lot # [id number]  Electronically signed: [place] 05/09/2016 9:38 AM

## 2016-05-09 NOTE — Anesthesia Preprocedure Evaluation (Signed)
Anesthesia Evaluation  Patient identified by MRN, date of birth, ID band Patient awake    Reviewed: Allergy & Precautions, H&P , NPO status , Patient's Chart, lab work & pertinent test results, reviewed documented beta blocker date and time   Airway Mallampati: II  TM Distance: >3 FB Neck ROM: full    Dental no notable dental hx. (+) Teeth Intact   Pulmonary neg pulmonary ROS, pneumonia, resolved, COPD, Current Smoker,    Pulmonary exam normal breath sounds clear to auscultation       Cardiovascular Exercise Tolerance: Good hypertension, negative cardio ROS   Rhythm:regular Rate:Normal     Neuro/Psych PSYCHIATRIC DISORDERS negative neurological ROS  negative psych ROS   GI/Hepatic negative GI ROS, Neg liver ROS, hiatal hernia, GERD  ,  Endo/Other  negative endocrine ROSdiabetes  Renal/GU      Musculoskeletal   Abdominal   Peds  Hematology negative hematology ROS (+)   Anesthesia Other Findings   Reproductive/Obstetrics negative OB ROS                             Anesthesia Physical Anesthesia Plan  ASA: III  Anesthesia Plan: MAC   Post-op Pain Management:    Induction:   Airway Management Planned:   Additional Equipment:   Intra-op Plan:   Post-operative Plan:   Informed Consent: I have reviewed the patients History and Physical, chart, labs and discussed the procedure including the risks, benefits and alternatives for the proposed anesthesia with the patient or authorized representative who has indicated his/her understanding and acceptance.     Plan Discussed with: CRNA  Anesthesia Plan Comments:         Anesthesia Quick Evaluation

## 2016-05-09 NOTE — H&P (Signed)
All labs reviewed. Abnormal studies sent to patients PCP when indicated.  Previous H&P reviewed, patient examined, there are NO CHANGES.  Terri James LOUIS3/27/20189:12 AM

## 2016-05-09 NOTE — Anesthesia Postprocedure Evaluation (Signed)
Anesthesia Post Note  Patient: SANAYA GWILLIAM  Procedure(s) Performed: Procedure(s) (LRB): CATARACT EXTRACTION PHACO AND INTRAOCULAR LENS PLACEMENT (IOC) (Left)  Patient location during evaluation: PACU Anesthesia Type: MAC Level of consciousness: awake and alert Pain management: pain level controlled Vital Signs Assessment: post-procedure vital signs reviewed and stable Respiratory status: spontaneous breathing, nonlabored ventilation, respiratory function stable and patient connected to nasal cannula oxygen Cardiovascular status: stable and blood pressure returned to baseline Anesthetic complications: no     Last Vitals:  Vitals:   05/09/16 0809 05/09/16 0940  BP: 101/75 103/62  Pulse: (!) 101 81  Resp: 16 18  Temp: 36.9 C     Last Pain:  Vitals:   05/09/16 0940  TempSrc: Oral                 Darlyne Russian

## 2016-05-09 NOTE — Anesthesia Post-op Follow-up Note (Cosign Needed)
Anesthesia QCDR form completed.        

## 2016-05-09 NOTE — Transfer of Care (Signed)
Immediate Anesthesia Transfer of Care Note  Patient: Terri James  Procedure(s) Performed: Procedure(s) with comments: CATARACT EXTRACTION PHACO AND INTRAOCULAR LENS PLACEMENT (IOC) (Left) - [place] 00:49 AP% 17.2 CDE 8.45 Fluid pack lot # [id number]  Patient Location: PACU  Anesthesia Type:MAC  Level of Consciousness: awake, alert  and oriented  Airway & Oxygen Therapy: Patient Spontanous Breathing  Post-op Assessment: Report given to RN and Post -op Vital signs reviewed and stable  Post vital signs: Reviewed and stable  Last Vitals:  Vitals:   05/03/16 0955 05/09/16 0809  BP: (!) 100/59 101/75  Pulse: (!) 105 (!) 101  Resp:  16  Temp:  36.9 C    Last Pain:  Vitals:   05/09/16 0809  TempSrc: Oral         Complications: No apparent anesthesia complications

## 2016-05-10 ENCOUNTER — Encounter: Payer: Self-pay | Admitting: Ophthalmology

## 2016-08-11 ENCOUNTER — Other Ambulatory Visit: Payer: Self-pay | Admitting: Student

## 2016-08-11 DIAGNOSIS — G9519 Other vascular myelopathies: Secondary | ICD-10-CM

## 2016-08-11 DIAGNOSIS — M48062 Spinal stenosis, lumbar region with neurogenic claudication: Secondary | ICD-10-CM

## 2016-08-11 DIAGNOSIS — M5136 Other intervertebral disc degeneration, lumbar region: Secondary | ICD-10-CM

## 2016-08-23 ENCOUNTER — Ambulatory Visit
Admission: RE | Admit: 2016-08-23 | Discharge: 2016-08-23 | Disposition: A | Discharge status: Home / Self Care | Payer: Medicare Other | Source: Ambulatory Visit | Attending: Student | Admitting: Student

## 2016-08-23 ENCOUNTER — Other Ambulatory Visit: Payer: BLUE CROSS/BLUE SHIELD

## 2016-08-23 DIAGNOSIS — N83201 Unspecified ovarian cyst, right side: Secondary | ICD-10-CM | POA: Diagnosis not present

## 2016-08-23 DIAGNOSIS — G9519 Other vascular myelopathies: Secondary | ICD-10-CM

## 2016-08-23 DIAGNOSIS — Z981 Arthrodesis status: Secondary | ICD-10-CM | POA: Diagnosis not present

## 2016-08-23 DIAGNOSIS — M4186 Other forms of scoliosis, lumbar region: Secondary | ICD-10-CM | POA: Insufficient documentation

## 2016-08-23 DIAGNOSIS — M48062 Spinal stenosis, lumbar region with neurogenic claudication: Secondary | ICD-10-CM | POA: Diagnosis present

## 2016-08-23 DIAGNOSIS — M5136 Other intervertebral disc degeneration, lumbar region: Secondary | ICD-10-CM | POA: Diagnosis not present

## 2017-04-05 ENCOUNTER — Other Ambulatory Visit: Payer: Self-pay | Admitting: Internal Medicine

## 2017-04-05 DIAGNOSIS — Z1231 Encounter for screening mammogram for malignant neoplasm of breast: Secondary | ICD-10-CM

## 2017-04-16 ENCOUNTER — Ambulatory Visit
Admission: RE | Admit: 2017-04-16 | Discharge: 2017-04-16 | Disposition: A | Discharge status: Home / Self Care | Payer: Medicare Other | Source: Ambulatory Visit | Attending: Internal Medicine | Admitting: Internal Medicine

## 2017-04-16 DIAGNOSIS — Z1231 Encounter for screening mammogram for malignant neoplasm of breast: Secondary | ICD-10-CM | POA: Diagnosis present

## 2017-07-19 ENCOUNTER — Encounter: Payer: Self-pay | Admitting: Emergency Medicine

## 2017-07-19 ENCOUNTER — Ambulatory Visit
Admission: EM | Admit: 2017-07-19 | Discharge: 2017-07-19 | Disposition: A | Discharge status: Home / Self Care | Payer: Medicare Other | Attending: Family Medicine | Admitting: Family Medicine

## 2017-07-19 ENCOUNTER — Other Ambulatory Visit: Payer: Self-pay

## 2017-07-19 DIAGNOSIS — E785 Hyperlipidemia, unspecified: Secondary | ICD-10-CM | POA: Diagnosis not present

## 2017-07-19 DIAGNOSIS — K219 Gastro-esophageal reflux disease without esophagitis: Secondary | ICD-10-CM | POA: Diagnosis not present

## 2017-07-19 DIAGNOSIS — Z853 Personal history of malignant neoplasm of breast: Secondary | ICD-10-CM | POA: Insufficient documentation

## 2017-07-19 DIAGNOSIS — Z9842 Cataract extraction status, left eye: Secondary | ICD-10-CM | POA: Diagnosis not present

## 2017-07-19 DIAGNOSIS — H9190 Unspecified hearing loss, unspecified ear: Secondary | ICD-10-CM | POA: Diagnosis not present

## 2017-07-19 DIAGNOSIS — J449 Chronic obstructive pulmonary disease, unspecified: Secondary | ICD-10-CM | POA: Insufficient documentation

## 2017-07-19 DIAGNOSIS — N3001 Acute cystitis with hematuria: Secondary | ICD-10-CM

## 2017-07-19 DIAGNOSIS — M545 Low back pain: Secondary | ICD-10-CM | POA: Insufficient documentation

## 2017-07-19 DIAGNOSIS — E119 Type 2 diabetes mellitus without complications: Secondary | ICD-10-CM | POA: Diagnosis not present

## 2017-07-19 DIAGNOSIS — Z90710 Acquired absence of both cervix and uterus: Secondary | ICD-10-CM | POA: Diagnosis not present

## 2017-07-19 DIAGNOSIS — Z79899 Other long term (current) drug therapy: Secondary | ICD-10-CM | POA: Diagnosis not present

## 2017-07-19 DIAGNOSIS — Z9841 Cataract extraction status, right eye: Secondary | ICD-10-CM | POA: Diagnosis not present

## 2017-07-19 DIAGNOSIS — Z87891 Personal history of nicotine dependence: Secondary | ICD-10-CM | POA: Insufficient documentation

## 2017-07-19 DIAGNOSIS — G8929 Other chronic pain: Secondary | ICD-10-CM | POA: Diagnosis not present

## 2017-07-19 DIAGNOSIS — Z981 Arthrodesis status: Secondary | ICD-10-CM | POA: Diagnosis not present

## 2017-07-19 DIAGNOSIS — M1990 Unspecified osteoarthritis, unspecified site: Secondary | ICD-10-CM | POA: Insufficient documentation

## 2017-07-19 DIAGNOSIS — R319 Hematuria, unspecified: Secondary | ICD-10-CM | POA: Diagnosis present

## 2017-07-19 DIAGNOSIS — F329 Major depressive disorder, single episode, unspecified: Secondary | ICD-10-CM | POA: Diagnosis not present

## 2017-07-19 DIAGNOSIS — Z833 Family history of diabetes mellitus: Secondary | ICD-10-CM | POA: Insufficient documentation

## 2017-07-19 DIAGNOSIS — Z961 Presence of intraocular lens: Secondary | ICD-10-CM | POA: Diagnosis not present

## 2017-07-19 LAB — URINALYSIS, COMPLETE (UACMP) WITH MICROSCOPIC
Bilirubin Urine: NEGATIVE
Glucose, UA: NEGATIVE mg/dL
Ketones, ur: NEGATIVE mg/dL
Nitrite: POSITIVE — AB
Protein, ur: >300 mg/dL — AB
RBC / HPF: >50 RBC/hpf (ref 0–5)
SPECIFIC GRAVITY, URINE: 1.025 (ref 1.005–1.030)
pH: 7 (ref 5.0–8.0)

## 2017-07-19 MED ORDER — CEPHALEXIN 500 MG PO CAPS: 500.0000 mg | ORAL_CAPSULE | Freq: Two times a day (BID) | ORAL | 0 refills | Status: DC

## 2017-07-19 NOTE — Discharge Instructions (Signed)
Antibiotic as prescribed.  Take care  Dr. Tehilla Coffel  

## 2017-07-19 NOTE — ED Provider Notes (Signed)
[facility]    CSN: [id number] Arrival date & time: 07/19/17  1843  History   Chief Complaint Chief Complaint  Patient presents with  . Hematuria   HPI  67 year old female presents with dysuria and hematuria.  Patient states that last night she developed dysuria after urinating.  She is continued to have dysuria today.  She has noticed hematuria today as well.  She reports the passage of small clots.  Patient reports urinary frequency but attributes this to the Lasix.  No reports of urgency.  No fever.  No back pain.  No flank pain.  No nausea or vomiting.  No abdominal pain.  No known exacerbating or relieving factors.  No other reported symptoms.  She endorses a history of prior UTIs.  No other associated symptoms.  No other complaints.  Past Medical History:  Diagnosis Date  . Arthritis   . Cancer [facility] 2003   breast  . Chronic low back pain    continuous use of opioids  . COPD (chronic obstructive pulmonary disease) ([place])   . Depression   . Diabetes mellitus without complication (HCC)    diet controlled  . GERD (gastroesophageal reflux disease)   . History of hiatal hernia   . HOH (hard of hearing)   . Hyperlipidemia   . Left knee pain   . Osteoporosis   . Pneumonia    Past Surgical History:  Procedure Laterality Date  . ABDOMINAL HYSTERECTOMY    . BACK SURGERY  2002   fusion with screws, second surgery/  . BREAST EXCISIONAL BIOPSY Left 2003  . BREAST LUMPECTOMY Left 2003   f/u with chemo and radiation   . CATARACT EXTRACTION W/PHACO Right 08/24/2015   Procedure: CATARACT EXTRACTION PHACO AND INTRAOCULAR LENS PLACEMENT (IOC);  Surgeon: Birder Robson, MD;  Location: [facility] ORS;  Service: Ophthalmology;  Laterality: Right;  [place] 1.02 AP% 18.2 CDE 11.28 Fluid Pack lot # [id number]  . CATARACT EXTRACTION W/PHACO Left 05/09/2016   Procedure: CATARACT EXTRACTION PHACO AND INTRAOCULAR LENS PLACEMENT (IOC);  Surgeon: Birder Robson, MD;  Location: [facility] ORS;   Service: Ophthalmology;  Laterality: Left;  [place] 00:49 AP% 17.2 CDE 8.45 Fluid pack lot # [id number]  . COLONOSCOPY WITH PROPOFOL N/A 08/06/2015   Procedure: COLONOSCOPY WITH PROPOFOL;  Surgeon: Manya Silvas, MD;  Location: [facility] ENDOSCOPY;  Service: Endoscopy;  Laterality: N/A;  . DILATION AND CURETTAGE OF UTERUS    . ESOPHAGOGASTRODUODENOSCOPY (EGD) WITH PROPOFOL N/A 08/06/2015   Procedure: ESOPHAGOGASTRODUODENOSCOPY (EGD) WITH PROPOFOL;  Surgeon: Manya Silvas, MD;  Location: [facility] ENDOSCOPY;  Service: Endoscopy;  Laterality: N/A;  . ganglion cyst removal Left   . POSTERIOR FUSION LUMBAR SPINE     x2  . TONSILLECTOMY      OB History   None    Home Medications    Prior to Admission medications   Medication Sig Start Date End Date Taking? Authorizing Provider  acetaminophen (TYLENOL) 325 MG tablet Take 650 mg by mouth 2 (two) times daily as needed for moderate pain or headache.    Yes [provider]  Calcium Carbonate-Vitamin D 600-125 MG-UNIT TABS Take 1 tablet by mouth daily.   Yes [provider]  clonazePAM (KLONOPIN) 0.5 MG tablet Take 0.25 mg by mouth daily as needed for anxiety.    Yes [provider]  cyclobenzaprine (FLEXERIL) 10 MG tablet Take 10 mg by mouth at bedtime.    Yes [provider]  furosemide (LASIX) 20 MG tablet Take 20 mg  by mouth.   Yes [provider]  gabapentin (NEURONTIN) 600 MG tablet Take 600 mg by mouth 4 (four) times daily.   Yes [provider]  Iron-Vitamins (GERITOL COMPLETE PO) Take 1 tablet by mouth daily.   Yes [provider]  morphine (MS CONTIN) 15 MG 12 hr tablet Take 15 mg by mouth every 8 (eight) hours.   Yes [provider]  pantoprazole (PROTONIX) 40 MG tablet Take 40 mg by mouth daily.   Yes [provider]  simvastatin (ZOCOR) 40 MG tablet Take 40 mg by mouth at bedtime.    Yes [provider]  varenicline (CHANTIX) 1 MG tablet Take 1 mg by mouth  2 (two) times daily.   Yes [provider]  cephALEXin (KEFLEX) 500 MG capsule Take 1 capsule (500 mg total) by mouth 2 (two) times daily. 07/19/17   Coral Spikes, DO    Family History Family History  Problem Relation Age of Onset  . Diabetes Father     Social History Social History   Tobacco Use  . Smoking status: Former Smoker    Years: 42.00    Types: Cigarettes  . Smokeless tobacco: Never Used  Substance Use Topics  . Alcohol use: Yes    Comment: once in a while  . Drug use: No     Allergies   Patient has no known allergies.   Review of Systems Review of Systems  Constitutional: Negative.   Gastrointestinal: Negative.   Genitourinary: Positive for dysuria and hematuria. Negative for flank pain.   Physical Exam Triage Vital Signs ED Triage Vitals  Enc Vitals Group     BP 07/19/17 1856 112/70     Pulse Rate 07/19/17 1856 90     Resp 07/19/17 1856 18     Temp 07/19/17 1856 99 F (37.2 C)     Temp Source 07/19/17 1856 Oral     SpO2 07/19/17 1856 100 %     Weight 07/19/17 1853 142 lb (64.4 kg)     Height 07/19/17 1853 5\' 6"  (1.676 m)     Head Circumference --      Peak Flow --      Pain Score 07/19/17 1853 0     Pain Loc --      Pain Edu? --      Excl. in [place]? --    No data found.  Updated Vital Signs BP 112/70 (BP Location: Left Arm)   Pulse 90   Temp 99 F (37.2 C) (Oral)   Resp 18   Ht 5\' 6"  (1.676 m)   Wt 142 lb (64.4 kg)   SpO2 100%   BMI 22.92 kg/m    Physical Exam  Constitutional: She is oriented to person, place, and time. She appears well-developed. No distress.  Cardiovascular: Normal rate and regular rhythm.  Pulmonary/Chest: [place] normal and breath sounds normal. She has no wheezes. She has no rales.  Abdominal: Soft. She exhibits no distension. There is no tenderness.  No CVA tenderness.  Neurological: She is alert and oriented to person, place, and time.  Psychiatric: She has a normal mood and affect. Her behavior is  normal.  Nursing note and vitals reviewed.  UC Treatments / Results  Labs (all labs ordered are listed, but only abnormal results are displayed) Labs Reviewed  URINALYSIS, COMPLETE (UACMP) WITH MICROSCOPIC - Abnormal; Notable for the following components:      Result Value   Color, Urine AMBER (*)  APPearance CLOUDY (*)    Hgb urine dipstick LARGE (*)    Protein, ur >300 (*)    Nitrite POSITIVE (*)    Leukocytes, UA MODERATE (*)    Bacteria, UA FEW (*)    All other components within normal limits  URINE CULTURE    EKG None  Radiology No results found.  Procedures Procedures (including critical care time)  Medications Ordered in UC Medications - No data to display  Initial Impression / Assessment and Plan / UC Course  I have reviewed the triage vital signs and the nursing notes.  Pertinent labs & imaging results that were available during my care of the patient were reviewed by me and considered in my medical decision making (see chart for details).    67 year old female presents with acute cystitis with hematuria.  Sending culture.  Placing on Keflex.  Final Clinical Impressions(s) / UC Diagnoses   Final diagnoses:  Acute cystitis with hematuria     Discharge Instructions     Antibiotic as prescribed.  Take care  Dr. Lacinda Axon     ED Prescriptions    Medication Sig Dispense Auth. Provider   cephALEXin (KEFLEX) 500 MG capsule Take 1 capsule (500 mg total) by mouth 2 (two) times daily. 14 capsule Coral Spikes, DO     Controlled Substance Prescriptions [place] Controlled Substance Registry consulted? Not Applicable   Coral Spikes, DO 07/19/17 1931

## 2017-07-19 NOTE — ED Triage Notes (Signed)
Patient c/o urinary burning that started yesterday. When she urinated this afternoon she noticed blood in her urine.

## 2017-07-22 LAB — URINE CULTURE: Culture: >=100000 — AB

## 2017-08-01 ENCOUNTER — Telehealth (HOSPITAL_COMMUNITY): Payer: Self-pay

## 2017-08-01 NOTE — Telephone Encounter (Signed)
Urine culture positive for Pseudomonas Aeruginosa. Per Dr. Meda Coffee this was treated with Keflex at urgent care visit. Pt contacted and made aware

## 2017-09-05 ENCOUNTER — Telehealth: Payer: Self-pay | Admitting: *Deleted

## 2017-09-05 DIAGNOSIS — Z122 Encounter for screening for malignant neoplasm of respiratory organs: Secondary | ICD-10-CM

## 2017-09-05 DIAGNOSIS — Z87891 Personal history of nicotine dependence: Secondary | ICD-10-CM

## 2017-09-05 NOTE — Telephone Encounter (Signed)
Received referral for initial lung cancer screening scan. Contacted patient and obtained smoking history,(current, 46 pack year) as well as answering questions related to screening process. Patient denies signs of lung cancer such as weight loss or hemoptysis. Patient denies comorbidity that would prevent curative treatment if lung cancer were found. Patient is scheduled for shared decision making visit and CT scan on 09/24/17.

## 2017-09-20 ENCOUNTER — Telehealth: Payer: Self-pay | Admitting: Nurse Practitioner

## 2017-09-24 ENCOUNTER — Inpatient Hospital Stay: Payer: Medicare Other | Attending: Nurse Practitioner | Admitting: Nurse Practitioner

## 2017-09-24 ENCOUNTER — Encounter: Payer: Self-pay | Admitting: Nurse Practitioner

## 2017-09-24 ENCOUNTER — Ambulatory Visit
Admission: RE | Admit: 2017-09-24 | Discharge: 2017-09-24 | Disposition: A | Discharge status: Home / Self Care | Payer: Medicare Other | Source: Ambulatory Visit | Attending: Nurse Practitioner | Admitting: Nurse Practitioner

## 2017-09-24 DIAGNOSIS — J432 Centrilobular emphysema: Secondary | ICD-10-CM | POA: Insufficient documentation

## 2017-09-24 DIAGNOSIS — Z122 Encounter for screening for malignant neoplasm of respiratory organs: Secondary | ICD-10-CM | POA: Diagnosis not present

## 2017-09-24 DIAGNOSIS — Z87891 Personal history of nicotine dependence: Secondary | ICD-10-CM | POA: Diagnosis not present

## 2017-09-24 DIAGNOSIS — K8020 Calculus of gallbladder without cholecystitis without obstruction: Secondary | ICD-10-CM | POA: Insufficient documentation

## 2017-09-24 DIAGNOSIS — I700 Atherosclerosis of aorta: Secondary | ICD-10-CM | POA: Diagnosis not present

## 2017-09-24 NOTE — Progress Notes (Signed)
In accordance with CMS guidelines, patient has met eligibility criteria including age, absence of signs or symptoms of lung cancer.  Social History   Tobacco Use  . Smoking status: Current Every Day Smoker    Packs/day: 1.00    Years: 46.00    Pack years: 46.00    Types: Cigarettes  . Smokeless tobacco: Never Used  Substance Use Topics  . Alcohol use: Yes    Comment: once in a while  . Drug use: No      A shared decision-making session was conducted prior to the performance of CT scan. This includes one or more decision aids, includes benefits and harms of screening, follow-up diagnostic testing, over-diagnosis, false positive rate, and total radiation exposure.   Counseling on the importance of adherence to annual lung cancer LDCT screening, impact of co-morbidities, and ability or willingness to undergo diagnosis and treatment is imperative for compliance of the program.   Counseling on the importance of continued smoking cessation for former smokers; the importance of smoking cessation for current smokers, and information about tobacco cessation interventions have been given to patient including [place] and 1800 quit [place] programs.   Written order for lung cancer screening with LDCT has been given to the patient and any and all questions have been answered to the best of my abilities.    Yearly follow up will be coordinated by Burgess Estelle, Thoracic Navigator.  Beckey Rutter, DNP, AGNP-C [place] at [facility] [phone] (work cell) [phone] (office) 09/24/17 3:26 PM

## 2017-09-26 ENCOUNTER — Encounter: Payer: Self-pay | Admitting: *Deleted

## 2018-09-24 ENCOUNTER — Telehealth: Payer: Self-pay | Admitting: *Deleted

## 2018-09-24 NOTE — Telephone Encounter (Signed)
Left message for patient to notify them that it is time to schedule annual low dose lung cancer screening CT scan. Instructed patient to call back to verify information prior to the scan being scheduled.  

## 2018-09-25 ENCOUNTER — Other Ambulatory Visit: Payer: Self-pay | Admitting: Internal Medicine

## 2018-09-25 DIAGNOSIS — Z1231 Encounter for screening mammogram for malignant neoplasm of breast: Secondary | ICD-10-CM

## 2018-10-17 ENCOUNTER — Telehealth: Payer: Self-pay | Admitting: *Deleted

## 2018-10-17 DIAGNOSIS — Z87891 Personal history of nicotine dependence: Secondary | ICD-10-CM

## 2018-10-17 DIAGNOSIS — Z122 Encounter for screening for malignant neoplasm of respiratory organs: Secondary | ICD-10-CM

## 2018-10-17 NOTE — Telephone Encounter (Signed)
Patient has been notified that annual lung cancer screening low dose CT scan is due currently or will be in near future. Confirmed that patient is within the age range of 55-77, and asymptomatic, (no signs or symptoms of lung cancer). Patient denies illness that would prevent curative treatment for lung cancer if found. Verified smoking history, (current, 46.5 pack year). The shared decision making visit was done 09/24/17. Patient is agreeable for CT scan being scheduled.

## 2018-10-23 ENCOUNTER — Other Ambulatory Visit: Payer: Self-pay

## 2018-10-23 ENCOUNTER — Ambulatory Visit
Admission: RE | Admit: 2018-10-23 | Discharge: 2018-10-23 | Disposition: A | Discharge status: Home / Self Care | Payer: Medicare Other | Source: Ambulatory Visit | Attending: Oncology | Admitting: Oncology

## 2018-10-23 DIAGNOSIS — Z122 Encounter for screening for malignant neoplasm of respiratory organs: Secondary | ICD-10-CM | POA: Diagnosis not present

## 2018-10-23 DIAGNOSIS — Z87891 Personal history of nicotine dependence: Secondary | ICD-10-CM | POA: Diagnosis present

## 2018-10-25 ENCOUNTER — Encounter: Payer: Self-pay | Admitting: *Deleted

## 2018-10-28 ENCOUNTER — Ambulatory Visit
Admission: RE | Admit: 2018-10-28 | Discharge: 2018-10-28 | Disposition: A | Discharge status: Home / Self Care | Payer: Medicare Other | Source: Ambulatory Visit | Attending: Internal Medicine | Admitting: Internal Medicine

## 2018-10-28 DIAGNOSIS — Z1231 Encounter for screening mammogram for malignant neoplasm of breast: Secondary | ICD-10-CM | POA: Diagnosis present

## 2019-05-07 ENCOUNTER — Other Ambulatory Visit: Payer: Self-pay

## 2019-05-07 ENCOUNTER — Ambulatory Visit: Payer: Medicare Other

## 2019-05-07 ENCOUNTER — Encounter: Payer: Self-pay | Admitting: Emergency Medicine

## 2019-05-07 ENCOUNTER — Ambulatory Visit (INDEPENDENT_AMBULATORY_CARE_PROVIDER_SITE_OTHER)
Admission: EM | Admit: 2019-05-07 | Discharge: 2019-05-07 | Disposition: A | Discharge status: Home / Self Care | Payer: Medicare Other | Source: Home / Self Care | Attending: Family Medicine | Admitting: Family Medicine

## 2019-05-07 ENCOUNTER — Ambulatory Visit: Admit: 2019-05-07 | Discharge: 2019-05-07 | Disposition: A | Discharge status: Home / Self Care | Payer: Medicare Other

## 2019-05-07 ENCOUNTER — Inpatient Hospital Stay
Admission: EM | Admit: 2019-05-07 | Discharge: 2019-05-09 | DRG: 446 | Disposition: A | Discharge status: Home / Self Care | Payer: Medicare Other | Attending: Surgery | Admitting: Surgery

## 2019-05-07 DIAGNOSIS — R509 Fever, unspecified: Secondary | ICD-10-CM

## 2019-05-07 DIAGNOSIS — F17210 Nicotine dependence, cigarettes, uncomplicated: Secondary | ICD-10-CM | POA: Diagnosis present

## 2019-05-07 DIAGNOSIS — K810 Acute cholecystitis: Secondary | ICD-10-CM

## 2019-05-07 DIAGNOSIS — Z20822 Contact with and (suspected) exposure to covid-19: Secondary | ICD-10-CM | POA: Diagnosis present

## 2019-05-07 LAB — URINALYSIS, COMPLETE (UACMP) WITH MICROSCOPIC
Bacteria, UA: NONE SEEN
Glucose, UA: NEGATIVE mg/dL
Ketones, ur: NEGATIVE mg/dL
Leukocytes,Ua: NEGATIVE
Nitrite: NEGATIVE
Protein, ur: 30 mg/dL — AB
Specific Gravity, Urine: 1.025 (ref 1.005–1.030)
WBC, UA: NONE SEEN WBC/hpf (ref 0–5)
pH: 7 (ref 5.0–8.0)

## 2019-05-07 LAB — COMPREHENSIVE METABOLIC PANEL
ALT: 31 U/L (ref 0–44)
AST: 33 U/L (ref 15–41)
Albumin: 3.3 g/dL — ABNORMAL LOW (ref 3.5–5.0)
Alkaline Phosphatase: 117 U/L (ref 38–126)
Anion gap: 10 (ref 5–15)
BUN: 7 mg/dL — ABNORMAL LOW (ref 8–23)
CO2: 25 mmol/L (ref 22–32)
Calcium: 8.5 mg/dL — ABNORMAL LOW (ref 8.9–10.3)
Chloride: 99 mmol/L (ref 98–111)
Creatinine, Ser: 0.68 mg/dL (ref 0.44–1.00)
GFR calc Af Amer: >60 mL/min (ref >60)
GFR calc non Af Amer: >60 mL/min (ref >60)
Glucose, Bld: 141 mg/dL — ABNORMAL HIGH (ref 70–99)
Potassium: 3.4 mmol/L — ABNORMAL LOW (ref 3.5–5.1)
Sodium: 134 mmol/L — ABNORMAL LOW (ref 135–145)
Total Bilirubin: 0.5 mg/dL (ref 0.3–1.2)
Total Protein: 6.6 g/dL (ref 6.5–8.1)

## 2019-05-07 LAB — CBC WITH DIFFERENTIAL/PLATELET
Abs Immature Granulocytes: 0.05 10*3/uL (ref 0.00–0.07)
Basophils Absolute: 0.1 10*3/uL (ref 0.0–0.1)
Basophils Relative: 0 %
Eosinophils Absolute: 0 10*3/uL (ref 0.0–0.5)
Eosinophils Relative: 0 %
HCT: 42.4 % (ref 36.0–46.0)
Hemoglobin: 14.1 g/dL (ref 12.0–15.0)
Immature Granulocytes: 0 %
Lymphocytes Relative: 11 %
Lymphs Abs: 1.7 10*3/uL (ref 0.7–4.0)
MCH: 32.6 pg (ref 26.0–34.0)
MCHC: 33.3 g/dL (ref 30.0–36.0)
MCV: 97.9 fL (ref 80.0–100.0)
Monocytes Absolute: 1.6 10*3/uL — ABNORMAL HIGH (ref 0.1–1.0)
Monocytes Relative: 11 %
Neutro Abs: 11.6 10*3/uL — ABNORMAL HIGH (ref 1.7–7.7)
Neutrophils Relative %: 78 %
Platelets: 208 10*3/uL (ref 150–400)
RBC: 4.33 MIL/uL (ref 3.87–5.11)
RDW: 12.4 % (ref 11.5–15.5)
WBC: 15 10*3/uL — ABNORMAL HIGH (ref 4.0–10.5)
nRBC: 0 % (ref 0.0–0.2)

## 2019-05-07 LAB — RESPIRATORY PANEL BY RT PCR (FLU A&B, COVID)
Influenza A by PCR: NEGATIVE
Influenza B by PCR: NEGATIVE
SARS Coronavirus 2 by RT PCR: NEGATIVE

## 2019-05-07 LAB — LIPASE, BLOOD: Lipase: 16 U/L (ref 11–51)

## 2019-05-07 MED ORDER — MORPHINE SULFATE ER 15 MG PO TBCR
15.0000 mg | EXTENDED_RELEASE_TABLET | Freq: Three times a day (TID) | ORAL | Status: DC
  Administered 2019-05-08 (×2): 15 mg via ORAL
  Filled 2019-05-07 (×5): qty 1

## 2019-05-07 MED ORDER — ACETAMINOPHEN 325 MG PO TABS: 650.0000 mg | ORAL_TABLET | Freq: Two times a day (BID) | ORAL | Status: DC | PRN

## 2019-05-07 MED ORDER — CLONAZEPAM 0.5 MG PO TABS: 0.2500 mg | ORAL_TABLET | Freq: Every day | ORAL | Status: DC | PRN

## 2019-05-07 MED ORDER — VARENICLINE TARTRATE 1 MG PO TABS: 1.0000 mg | ORAL_TABLET | Freq: Two times a day (BID) | ORAL | Status: DC

## 2019-05-07 MED ORDER — DOCUSATE SODIUM 100 MG PO CAPS: 100.0000 mg | ORAL_CAPSULE | Freq: Two times a day (BID) | ORAL | Status: DC | PRN

## 2019-05-07 MED ORDER — SIMVASTATIN 40 MG PO TABS
40.0000 mg | ORAL_TABLET | Freq: Every day | ORAL | Status: DC
  Administered 2019-05-08 (×2): 40 mg via ORAL
  Filled 2019-05-07: qty 4
  Filled 2019-05-07 (×2): qty 1

## 2019-05-07 MED ORDER — ADULT MULTIVITAMIN W/MINERALS CH
1.0000 | ORAL_TABLET | Freq: Every day | ORAL | Status: DC
  Administered 2019-05-08 – 2019-05-09 (×2): 1 via ORAL
  Filled 2019-05-07 (×2): qty 1

## 2019-05-07 MED ORDER — GABAPENTIN 600 MG PO TABS
600.0000 mg | ORAL_TABLET | Freq: Four times a day (QID) | ORAL | Status: DC
  Administered 2019-05-08 (×2): 600 mg via ORAL
  Filled 2019-05-07 (×5): qty 1

## 2019-05-07 MED ORDER — PIPERACILLIN-TAZOBACTAM 3.375 G IVPB 30 MIN
3.3750 g | Freq: Once | INTRAVENOUS | Status: AC
  Administered 2019-05-07: 22:00:00 3.375 g via INTRAVENOUS
  Filled 2019-05-07: qty 50

## 2019-05-07 MED ORDER — ONDANSETRON 4 MG PO TBDP: 4.0000 mg | ORAL_TABLET | Freq: Four times a day (QID) | ORAL | Status: DC | PRN

## 2019-05-07 MED ORDER — IOHEXOL 300 MG/ML  SOLN
100.0000 mL | Freq: Once | INTRAMUSCULAR | Status: AC | PRN
  Administered 2019-05-07: 20:00:00 100 mL via INTRAVENOUS

## 2019-05-07 MED ORDER — ONDANSETRON HCL 4 MG/2ML IJ SOLN: 4.0000 mg | Freq: Four times a day (QID) | INTRAMUSCULAR | Status: DC | PRN

## 2019-05-07 MED ORDER — CYCLOBENZAPRINE HCL 10 MG PO TABS
10.0000 mg | ORAL_TABLET | Freq: Every day | ORAL | Status: DC
  Administered 2019-05-08 (×2): 10 mg via ORAL
  Filled 2019-05-07 (×3): qty 1

## 2019-05-07 MED ORDER — PIPERACILLIN-TAZOBACTAM 3.375 G IVPB
3.3750 g | Freq: Three times a day (TID) | INTRAVENOUS | Status: DC
  Administered 2019-05-08 – 2019-05-09 (×4): 3.375 g via INTRAVENOUS
  Filled 2019-05-07 (×8): qty 50

## 2019-05-07 MED ORDER — PANTOPRAZOLE SODIUM 40 MG PO TBEC
40.0000 mg | DELAYED_RELEASE_TABLET | Freq: Every day | ORAL | Status: DC
  Administered 2019-05-08 – 2019-05-09 (×2): 40 mg via ORAL
  Filled 2019-05-07 (×2): qty 1

## 2019-05-07 MED ORDER — CALCIUM CARBONATE-VITAMIN D 500-200 MG-UNIT PO TABS
1.0000 | ORAL_TABLET | Freq: Every day | ORAL | Status: DC
  Administered 2019-05-08 – 2019-05-09 (×2): 1 via ORAL
  Filled 2019-05-07 (×2): qty 1

## 2019-05-07 MED ORDER — SODIUM CHLORIDE 0.9 % IV BOLUS
1000.0000 mL | Freq: Once | INTRAVENOUS | Status: AC
  Administered 2019-05-07: 1000 mL via INTRAVENOUS

## 2019-05-07 NOTE — ED Triage Notes (Signed)
Pt comes EMS from urgent care with positive gallbladder on CT. 20G right FA started by urgent care. Abdominal pain started this morning.

## 2019-05-07 NOTE — ED Provider Notes (Signed)
[facility] Emergency Department Provider Note    First MD Initiated Contact with Patient 05/07/19 2115     (approximate)  I have reviewed the triage vital signs and the nursing notes.   HISTORY  Chief Complaint Abdominal Pain    HPI Terri James is a 69 y.o. female no significant past medical history presents the ER for evaluation of of 2 days of right upper quadrant pain and right shoulder pain.  States the pain initially started in her right shoulder on Monday.  Since then is slightly progressed becoming more severe this morning associated with nausea and vomiting as well as chills.  Was evaluated at an urgent care today where she had blood work as well as CT imaging showed evidence of leukocytosis as well as imaging showing evidence of acute cholecystitis.  She was sent to the ER for further evaluation and management.  She states that she last had something to eat yesterday morning.  States the pain is mild to moderate right now.  Has not had any antibiotics yet denies any allergies to antibiotics.  Not on any blood thinners.    Past Medical History:  Diagnosis Date  . Arthritis   . Cancer [facility]) 2003   left breast  . Chronic low back pain    continuous use of opioids  . COPD (chronic obstructive pulmonary disease) ([place])   . Depression   . Diabetes mellitus without complication (HCC)    diet controlled  . GERD (gastroesophageal reflux disease)   . History of hiatal hernia   . HOH (hard of hearing)   . Hyperlipidemia   . Left knee pain   . Osteoporosis   . Personal history of chemotherapy 2003   Left breast ca  . Personal history of radiation therapy 2003   left breast ca  . Pneumonia    Family History  Problem Relation Age of Onset  . Heart disease Mother   . Hypertension Mother   . Diabetes Father   . Macular degeneration Father   . Hypertension Father    Past Surgical History:  Procedure Laterality Date  . ABDOMINAL  HYSTERECTOMY    . BACK SURGERY  2002   fusion with screws, second surgery/  . BREAST EXCISIONAL BIOPSY Left 2003   positive  . BREAST LUMPECTOMY Left 2003   f/u with chemo and radiation   . CATARACT EXTRACTION W/PHACO Right 08/24/2015   Procedure: CATARACT EXTRACTION PHACO AND INTRAOCULAR LENS PLACEMENT (IOC);  Surgeon: Birder Robson, MD;  Location: [facility] ORS;  Service: Ophthalmology;  Laterality: Right;  [place] 1.02 AP% 18.2 CDE 11.28 Fluid Pack lot # [id number]  . CATARACT EXTRACTION W/PHACO Left 05/09/2016   Procedure: CATARACT EXTRACTION PHACO AND INTRAOCULAR LENS PLACEMENT (IOC);  Surgeon: Birder Robson, MD;  Location: [facility] ORS;  Service: Ophthalmology;  Laterality: Left;  [place] 00:49 AP% 17.2 CDE 8.45 Fluid pack lot # [id number]  . COLONOSCOPY WITH PROPOFOL N/A 08/06/2015   Procedure: COLONOSCOPY WITH PROPOFOL;  Surgeon: Manya Silvas, MD;  Location: [facility] ENDOSCOPY;  Service: Endoscopy;  Laterality: N/A;  . DILATION AND CURETTAGE OF UTERUS    . ESOPHAGOGASTRODUODENOSCOPY (EGD) WITH PROPOFOL N/A 08/06/2015   Procedure: ESOPHAGOGASTRODUODENOSCOPY (EGD) WITH PROPOFOL;  Surgeon: Manya Silvas, MD;  Location: [facility] ENDOSCOPY;  Service: Endoscopy;  Laterality: N/A;  . ganglion cyst removal Left   . POSTERIOR FUSION LUMBAR SPINE     x2  . TONSILLECTOMY     There are no problems to display for  this patient.     Prior to Admission medications   Medication Sig Start Date End Date Taking? Authorizing Provider  acetaminophen (TYLENOL) 325 MG tablet Take 650 mg by mouth 2 (two) times daily as needed for moderate pain or headache.     [provider]  busPIRone (BUSPAR) 15 MG tablet Take 15 mg by mouth 2 (two) times daily. 02/17/19   [provider]  Calcium Carbonate-Vitamin D 600-125 MG-UNIT TABS Take 1 tablet by mouth daily.    [provider]  cephALEXin (KEFLEX) 500 MG capsule Take 1 capsule (500 mg total) by mouth 2 (two) times daily. 07/19/17   Coral Spikes, DO    clonazePAM (KLONOPIN) 0.5 MG tablet Take 0.25 mg by mouth daily as needed for anxiety.     [provider]  cyclobenzaprine (FLEXERIL) 10 MG tablet Take 10 mg by mouth at bedtime.     [provider]  furosemide (LASIX) 20 MG tablet Take 20 mg by mouth.    [provider]  gabapentin (NEURONTIN) 600 MG tablet Take 600 mg by mouth 4 (four) times daily.    [provider]  Iron-Vitamins (GERITOL COMPLETE PO) Take 1 tablet by mouth daily.    [provider]  morphine (MS CONTIN) 15 MG 12 hr tablet Take 15 mg by mouth every 8 (eight) hours.    [provider]  pantoprazole (PROTONIX) 40 MG tablet Take 40 mg by mouth daily.    [provider]  simvastatin (ZOCOR) 40 MG tablet Take 40 mg by mouth at bedtime.     [provider]  varenicline (CHANTIX) 1 MG tablet Take 1 mg by mouth 2 (two) times daily.    [provider]    Allergies Patient has no known allergies.    Social History Social History   Tobacco Use  . Smoking status: Current Every Day Smoker    Packs/day: 1.00    Years: 46.00    Pack years: 46.00    Types: Cigarettes  . Smokeless tobacco: Never Used  Substance Use Topics  . Alcohol use: Yes    Comment: once in a while  . Drug use: No    Review of Systems Patient denies headaches, rhinorrhea, blurry vision, numbness, shortness of breath, chest pain, edema, cough, abdominal pain, nausea, vomiting, diarrhea, dysuria, fevers, rashes or hallucinations unless otherwise stated above in HPI. ____________________________________________   PHYSICAL EXAM:  VITAL SIGNS: Vitals:   05/07/19 2117 05/07/19 2119  BP: 127/72   Pulse: (!) 111   Resp: 15   Temp:  100 F (37.8 C)  SpO2: 97%     Constitutional: Alert and oriented.  Eyes: Conjunctivae are normal.  Head: Atraumatic. Nose: No congestion/rhinnorhea. Mouth/Throat: Mucous membranes are moist.   Neck: No stridor. Painless ROM.   Cardiovascular: mildly tachycardic rate, regular rhythm. Grossly normal heart sounds.  Good peripheral circulation. Respiratory: Normal respiratory effort.  No retractions. Lungs CTAB. Gastrointestinal: Soft with ruq ttp, No distention. No abdominal bruits. No CVA tenderness. Genitourinary:  Musculoskeletal: No lower extremity tenderness nor edema.  No joint effusions. Neurologic:  Normal speech and language. No gross focal neurologic deficits are appreciated. No facial droop Skin:  Skin is warm, dry and intact. No rash noted. Psychiatric: Mood and affect are normal. Speech and behavior are normal.  ____________________________________________   LABS (all labs ordered are listed, but only abnormal results are displayed)  Results for orders placed or performed during the hospital encounter of 05/07/19 (from the  past 24 hour(s))  CBC with Differential     Status: Abnormal   Collection Time: 05/07/19  6:59 PM  Result Value Ref Range   WBC 15.0 (H) 4.0 - 10.5 K/uL   RBC 4.33 3.87 - 5.11 MIL/uL   Hemoglobin 14.1 12.0 - 15.0 g/dL   HCT 42.4 36.0 - 46.0 %   MCV 97.9 80.0 - 100.0 fL   MCH 32.6 26.0 - 34.0 pg   MCHC 33.3 30.0 - 36.0 g/dL   RDW 12.4 11.5 - 15.5 %   Platelets 208 150 - 400 K/uL   nRBC 0.0 0.0 - 0.2 %   Neutrophils Relative % 78 %   Neutro Abs 11.6 (H) 1.7 - 7.7 K/uL   Lymphocytes Relative 11 %   Lymphs Abs 1.7 0.7 - 4.0 K/uL   Monocytes Relative 11 %   Monocytes Absolute 1.6 (H) 0.1 - 1.0 K/uL   Eosinophils Relative 0 %   Eosinophils Absolute 0.0 0.0 - 0.5 K/uL   Basophils Relative 0 %   Basophils Absolute 0.1 0.0 - 0.1 K/uL   Immature Granulocytes 0 %   Abs Immature Granulocytes 0.05 0.00 - 0.07 K/uL  Comprehensive metabolic panel     Status: Abnormal   Collection Time: 05/07/19  6:59 PM  Result Value Ref Range   Sodium 134 (L) 135 - 145 mmol/L   Potassium 3.4 (L) 3.5 - 5.1 mmol/L   Chloride 99 98 - 111 mmol/L   CO2 25 22 - 32 mmol/L   Glucose, Bld 141 (H) 70 -  99 mg/dL   BUN 7 (L) 8 - 23 mg/dL   Creatinine, Ser 0.68 0.44 - 1.00 mg/dL   Calcium 8.5 (L) 8.9 - 10.3 mg/dL   Total Protein 6.6 6.5 - 8.1 g/dL   Albumin 3.3 (L) 3.5 - 5.0 g/dL   AST 33 15 - 41 U/L   ALT 31 0 - 44 U/L   Alkaline Phosphatase 117 38 - 126 U/L   Total Bilirubin 0.5 0.3 - 1.2 mg/dL   GFR calc non Af Amer >60 >60 mL/min   GFR calc Af Amer >60 >60 mL/min   Anion gap 10 5 - 15  Lipase, blood     Status: None   Collection Time: 05/07/19  6:59 PM  Result Value Ref Range   Lipase 16 11 - 51 U/L  Urinalysis, Complete w Microscopic     Status: Abnormal   Collection Time: 05/07/19  7:31 PM  Result Value Ref Range   Color, Urine YELLOW YELLOW   APPearance HAZY (A) CLEAR   Specific Gravity, Urine 1.025 1.005 - 1.030   pH 7.0 5.0 - 8.0   Glucose, UA NEGATIVE NEGATIVE mg/dL   Hgb urine dipstick TRACE (A) NEGATIVE   Bilirubin Urine SMALL (A) NEGATIVE   Ketones, ur NEGATIVE NEGATIVE mg/dL   Protein, ur 30 (A) NEGATIVE mg/dL   Nitrite NEGATIVE NEGATIVE   Leukocytes,Ua NEGATIVE NEGATIVE   Squamous Epithelial / LPF 0-5 0 - 5   WBC, UA NONE SEEN 0 - 5 WBC/hpf   RBC / HPF 6-10 0 - 5 RBC/hpf   Bacteria, UA NONE SEEN NONE SEEN   Ca Oxalate Crys, UA PRESENT    ____________________________________________   ____________________________________________  RADIOLOGY  I personally reviewed all radiographic images ordered to evaluate for the above acute complaints and reviewed radiology reports and findings.  These findings were personally discussed with the patient.  Please see medical record for radiology report.  ____________________________________________  PROCEDURES  Procedure(s) performed:  Procedures    Critical Care performed: no ____________________________________________   INITIAL IMPRESSION / ASSESSMENT AND PLAN / ED COURSE  Pertinent labs & imaging results that were available during my care of the patient were reviewed by me and considered in my medical  decision making (see chart for details).   DDX: Cholecystitis, cholangitis, cholelithiasis, pancreatitis, gastritis, appendicitis, sepsis  JANEIRA LOUGHRAN is a 69 y.o. who presents to the ED with symptoms as described above.  Patient arrives febrile tachycardic with evidence of acute cholecystitis by imaging.  Does have leukocytosis.  I discussed case in consultation with Dr. Lysle Pearl of general surgery who plans evaluate patient for operative management.  Did order IV fluids as well as pain medication and antiemetic as well as IV antibiotics.  Have discussed with the patient and available family all diagnostics and treatments performed thus far and all questions were answered to the best of my ability. The patient demonstrates understanding and agreement with plan.      The patient was evaluated in Emergency Department today for the symptoms described in the history of present illness. He/she was evaluated in the context of the global COVID-19 pandemic, which necessitated consideration that the patient might be at risk for infection with the SARS-CoV-2 virus that causes COVID-19. Institutional protocols and algorithms that pertain to the evaluation of patients at risk for COVID-19 are in a state of rapid change based on information released by regulatory bodies including the CDC and federal and state organizations. These policies and algorithms were followed during the patient's care in the ED.  As part of my medical decision making, I reviewed the following data within the [place] notes reviewed and incorporated, Labs reviewed, notes from prior ED visits and [place] Controlled Substance Database   ____________________________________________   FINAL CLINICAL IMPRESSION(S) / ED DIAGNOSES  Final diagnoses:  Acute cholecystitis      NEW MEDICATIONS STARTED DURING THIS VISIT:  New Prescriptions   No medications on file     Note:  This document was prepared using  Dragon voice recognition software and may include unintentional dictation errors.    Merlyn Lot, MD 05/07/19 2127

## 2019-05-07 NOTE — ED Provider Notes (Signed)
[facility]    CSN: [id number] Arrival date & time: 05/07/19  1824  History   Chief Complaint Chief Complaint  Patient presents with   Fever   HPI   69 year old female presents with fever.  Patient reports that she developed fever on Monday.  It has persisted.  T-max 101.  She is currently febrile at 101.7.  Patient states that she has had some pain around the right shoulder/right shoulder blade she is also had some low back pain.  She had some brief abdominal pain which she states has been periumbilical.  She has not eaten much but does not feel particularly nauseated currently.  No vomiting.  She states that she is currently pain-free.  Denies sick contacts.  No cough.  No respiratory symptoms.  Denies urinary symptoms.  No relieving factors.  She has not eaten at all today.  She has drank some [facility] but nothing else.  No other reported symptoms.  No other complaints or concerns at this time.   Past Medical History:  Diagnosis Date   Arthritis    Cancer ([place]) 2003   left breast   Chronic low back pain    continuous use of opioids   COPD (chronic obstructive pulmonary disease) (HCC)    Depression    Diabetes mellitus without complication (HCC)    diet controlled   GERD (gastroesophageal reflux disease)    History of hiatal hernia    HOH (hard of hearing)    Hyperlipidemia    Left knee pain    Osteoporosis    Personal history of chemotherapy 2003   Left breast ca   Personal history of radiation therapy 2003   left breast ca   Pneumonia     There are no problems to display for this patient.   Past Surgical History:  Procedure Laterality Date   ABDOMINAL HYSTERECTOMY     BACK SURGERY  2002   fusion with screws, second surgery/   BREAST EXCISIONAL BIOPSY Left 2003   positive   BREAST LUMPECTOMY Left 2003   f/u with chemo and radiation    CATARACT EXTRACTION W/PHACO Right 08/24/2015   Procedure: CATARACT EXTRACTION PHACO AND  INTRAOCULAR LENS PLACEMENT ([place]);  Surgeon: Birder Robson, MD;  Location: [facility] ORS;  Service: Ophthalmology;  Laterality: Right;  [place] 1.02 AP% 18.2 CDE 11.28 Fluid Pack lot # [id number]   CATARACT EXTRACTION W/PHACO Left 05/09/2016   Procedure: CATARACT EXTRACTION PHACO AND INTRAOCULAR LENS PLACEMENT (IOC);  Surgeon: Birder Robson, MD;  Location: [facility] ORS;  Service: Ophthalmology;  Laterality: Left;  [place] 00:49 AP% 17.2 CDE 8.45 Fluid pack lot # [id number]   COLONOSCOPY WITH PROPOFOL N/A 08/06/2015   Procedure: COLONOSCOPY WITH PROPOFOL;  Surgeon: Manya Silvas, MD;  Location: [facility] ENDOSCOPY;  Service: Endoscopy;  Laterality: N/A;   DILATION AND CURETTAGE OF UTERUS     ESOPHAGOGASTRODUODENOSCOPY (EGD) WITH PROPOFOL N/A 08/06/2015   Procedure: ESOPHAGOGASTRODUODENOSCOPY (EGD) WITH PROPOFOL;  Surgeon: Manya Silvas, MD;  Location: [facility] ENDOSCOPY;  Service: Endoscopy;  Laterality: N/A;   ganglion cyst removal Left    POSTERIOR FUSION LUMBAR SPINE     x2   TONSILLECTOMY      OB History   No obstetric history on file.      Home Medications    Prior to Admission medications   Medication Sig Start Date End Date Taking? Authorizing Provider  acetaminophen (TYLENOL) 325 MG tablet Take 650 mg by mouth 2 (two) times daily as needed for moderate  pain or headache.    Yes [provider]  Calcium Carbonate-Vitamin D 600-125 MG-UNIT TABS Take 1 tablet by mouth daily.   Yes [provider]  clonazePAM (KLONOPIN) 0.5 MG tablet Take 0.25 mg by mouth daily as needed for anxiety.    Yes [provider]  cyclobenzaprine (FLEXERIL) 10 MG tablet Take 10 mg by mouth at bedtime.    Yes [provider]  furosemide (LASIX) 20 MG tablet Take 20 mg by mouth.   Yes [provider]  gabapentin (NEURONTIN) 600 MG tablet Take 600 mg by mouth 4 (four) times daily.   Yes [provider]  Iron-Vitamins (GERITOL COMPLETE PO) Take 1 tablet by mouth daily.    Yes [provider]  morphine (MS CONTIN) 15 MG 12 hr tablet Take 15 mg by mouth every 8 (eight) hours.   Yes [provider]  pantoprazole (PROTONIX) 40 MG tablet Take 40 mg by mouth daily.   Yes [provider]  simvastatin (ZOCOR) 40 MG tablet Take 40 mg by mouth at bedtime.    Yes [provider]  cephALEXin (KEFLEX) 500 MG capsule Take 1 capsule (500 mg total) by mouth 2 (two) times daily. 07/19/17   Coral Spikes, DO  varenicline (CHANTIX) 1 MG tablet Take 1 mg by mouth 2 (two) times daily.    [provider]    Family History Family History  Problem Relation Age of Onset   Heart disease Mother    Hypertension Mother    Diabetes Father    Macular degeneration Father    Hypertension Father     Social History Social History   Tobacco Use   Smoking status: Current Every Day Smoker    Packs/day: 1.00    Years: 46.00    Pack years: 46.00    Types: Cigarettes   Smokeless tobacco: Never Used  Substance Use Topics   Alcohol use: Yes    Comment: once in a while   Drug use: No     Allergies   Patient has no known allergies.   Review of Systems Review of Systems  Constitutional: Positive for appetite change and fever.  Respiratory: Negative.   Gastrointestinal: Positive for abdominal pain.   Physical Exam Triage Vital Signs ED Triage Vitals  Enc Vitals Group     BP 05/07/19 1841 100/69     Pulse Rate 05/07/19 1841 (!) 120     Resp 05/07/19 1841 18     Temp 05/07/19 1841 (!) 101.7 F (38.7 C)     Temp Source 05/07/19 1841 Oral     SpO2 05/07/19 1841 95 %     Weight 05/07/19 1842 142 lb (64.4 kg)     Height 05/07/19 1842 5\' 5"  (1.651 m)     Head Circumference --      Peak Flow --      Pain Score 05/07/19 1841 0     Pain Loc --      Pain Edu? --      Excl. in [place]? --    Updated Vital Signs BP 100/69 (BP Location: Left Arm)    Pulse (!) 120    Temp (!) 101.7 F (38.7 C) (Oral)    Resp 18    Ht 5\' 5"  (1.651  m)    Wt 64.4 kg    SpO2 95%    BMI 23.63 kg/m   Visual Acuity Right Eye Distance:   Left Eye Distance:   Bilateral Distance:  Right Eye Near:   Left Eye Near:    Bilateral Near:     Physical Exam Constitutional:      General: She is not in acute distress.    Appearance: Normal appearance.  HENT:     Head: Normocephalic and atraumatic.  Eyes:     General:        Right eye: No discharge.        Left eye: No discharge.     Conjunctiva/sclera: Conjunctivae normal.  Cardiovascular:     Rate and Rhythm: Regular rhythm. Tachycardia present.     Heart sounds: No murmur.  Pulmonary:     Effort: Pulmonary effort is normal.     Breath sounds: Normal breath sounds. No wheezing, rhonchi or rales.  Abdominal:     Palpations: Abdomen is soft.     Comments: Mild tenderness in the periumbilical region.  Patient with exquisite tenderness in the right upper quadrant.  Skin:    General: Skin is warm.     Findings: No rash.  Neurological:     Mental Status: She is alert.  Psychiatric:        Mood and Affect: Mood normal.        Behavior: Behavior normal.    UC Treatments / Results  Labs (all labs ordered are listed, but only abnormal results are displayed) Labs Reviewed  CBC WITH DIFFERENTIAL/PLATELET - Abnormal; Notable for the following components:      Result Value   WBC 15.0 (*)    Neutro Abs 11.6 (*)    Monocytes Absolute 1.6 (*)    All other components within normal limits  COMPREHENSIVE METABOLIC PANEL - Abnormal; Notable for the following components:   Sodium 134 (*)    Potassium 3.4 (*)    Glucose, Bld 141 (*)    BUN 7 (*)    Calcium 8.5 (*)    Albumin 3.3 (*)    All other components within normal limits  URINALYSIS, COMPLETE (UACMP) WITH MICROSCOPIC - Abnormal; Notable for the following components:   APPearance HAZY (*)    Hgb urine dipstick TRACE (*)    Bilirubin Urine SMALL (*)    Protein, ur 30 (*)    All other components within normal limits  SARS  CORONAVIRUS 2 (TAT 6-24 HRS)  LIPASE, BLOOD    EKG   Radiology DG Chest 2 View  Result Date: 05/07/2019 CLINICAL DATA:  Fever. EXAM: CHEST - 2 VIEW COMPARISON:  October 08, 2005 FINDINGS: The heart, hila, mediastinum, and pleura are normal. Mild scarring or atelectasis in the right base. No suspicious infiltrates. Hyperinflation of the lungs. IMPRESSION: COPD or emphysema.  Atelectasis or scarring in the right base. Electronically Signed   By: Dorise Bullion III M.D   On: 05/07/2019 19:24   CT ABDOMEN PELVIS W CONTRAST  Result Date: 05/07/2019 CLINICAL DATA:  Fever, abnormal labs, history of left breast cancer status post mastectomy EXAM: CT ABDOMEN AND PELVIS WITH CONTRAST TECHNIQUE: Multidetector CT imaging of the abdomen and pelvis was performed using the standard protocol following bolus administration of intravenous contrast. CONTRAST:  180mL OMNIPAQUE IOHEXOL 300 MG/ML  SOLN COMPARISON:  None. FINDINGS: Lower chest: Lung bases are clear. Hepatobiliary: There is diffuse gallbladder wall thickening, with noncalcified gallstones versus biliary sludge. Findings are diagnostic of acute cholecystitis. There is geographic fatty infiltration of the liver. No biliary dilation. Pancreas: Unremarkable. No pancreatic ductal dilatation or surrounding inflammatory changes. Spleen: Normal in size without focal abnormality. Adrenals/Urinary Tract: Adrenal glands are  unremarkable. Kidneys are normal, without renal calculi, focal lesion, or hydronephrosis. Bladder is unremarkable. Stomach/Bowel: No bowel obstruction or ileus. Normal retrocecal appendix. No bowel wall thickening or inflammatory change. Vascular/Lymphatic: Aortic atherosclerosis. No enlarged abdominal or pelvic lymph nodes. Reproductive: Status post hysterectomy. No adnexal masses. Other: No abdominal wall hernia or abnormality. No abdominopelvic ascites. Musculoskeletal: No acute or destructive bony lesions. Postsurgical changes are seen from lower  lumbar fusion. Reconstructed images demonstrate no additional findings. IMPRESSION: 1. Gallbladder wall thickening and noncalcified stones versus sludge, consistent with acute cholecystitis. Electronically Signed   By: Randa Ngo M.D.   On: 05/07/2019 20:24    Procedures Procedures (including critical care time)  Medications Ordered in UC Medications - No data to display  Initial Impression / Assessment and Plan / UC Course  I have reviewed the triage vital signs and the nursing notes.  Pertinent labs & imaging results that were available during my care of the patient were reviewed by me and considered in my medical decision making (see chart for details).    69 year old female presents with fever and some recent abdominal pain, back pain, shoulder pain.  Work-up obtained: CBC, CMP, lipase, chest x-ray, Covid PCR.  Laboratory studies notable for leukocytosis of 15.0.  Normal lipase.  Normal LFTs.  Chest x-ray negative.  Given tenderness on exam and fever, CT abdomen pelvis with contrast obtained.  CT interpreted independently by me Interpretation: Gallbladder wall thickening consistent with acute cholecystitis.  This confirmed by radiology interpretation.  This is an acute problem which is potentially life-threatening.  Patient is going emergently to the hospital.  Hospital has been notified.  Anticipate admission and cholecystectomy.  Final Clinical Impressions(s) / UC Diagnoses   Final diagnoses:  Acute cholecystitis   Discharge Instructions   None    ED Prescriptions    None     PDMP not reviewed this encounter.   Coral Spikes, [place] 05/07/19 2036

## 2019-05-07 NOTE — ED Triage Notes (Signed)
Patient had 1st covid vaccine 04/23/19.

## 2019-05-07 NOTE — H&P (Signed)
Subjective:   CC: acute cholecystitis  HPI:  Terri James is a 69 y.o. female who is consulted by Quentin Cornwall for evaluation of above cc.  Symptoms were first noted 2 days ago. Pain is sharp, only with pressure to abdomen. Started in right shoulder, then had more in LLQ, then now is RUQ.  Associated with nothing specific, exacerbated by nothing specific     Past Medical History:  has a past medical history of Arthritis, Cancer ([place]) (2003), Chronic low back pain, COPD (chronic obstructive pulmonary disease) ([place]), Depression, Diabetes mellitus without complication ([place]), GERD (gastroesophageal reflux disease), History of hiatal hernia, HOH (hard of hearing), Hyperlipidemia, Left knee pain, Osteoporosis, Personal history of chemotherapy (2003), Personal history of radiation therapy (2003), and Pneumonia.  Past Surgical History:  has a past surgical history that includes Posterior fusion lumbar spine; Tonsillectomy; Abdominal hysterectomy; ganglion cyst removal (Left); Colonoscopy with propofol (N/A, 08/06/2015); Esophagogastroduodenoscopy (egd) with propofol (N/A, 08/06/2015); Dilation and curettage of uterus; Back surgery (2002); Cataract extraction w/PHACO (Right, 08/24/2015); Cataract extraction w/PHACO (Left, 05/09/2016); Breast lumpectomy (Left, 2003); and Breast excisional biopsy (Left, 2003).  Family History: family history includes Diabetes in her father; Heart disease in her mother; Hypertension in her father and mother; Macular degeneration in her father.  Social History:  reports that she has been smoking cigarettes. She has a 46.00 pack-year smoking history. She has never used smokeless tobacco. She reports current alcohol use. She reports that she does not use drugs.  Current Medications:  acetaminophen (TYLENOL) 325 MG tablet Take 650 mg by mouth 2 (two) times daily as needed for moderate pain or headache.  Benjamine Sprague, DO Reordered  Ordered as: acetaminophen (TYLENOL) tablet 650 mg -  650 mg, Oral, 2 times daily PRN, moderate pain, headache, Starting Wed 05/07/19 at 2209  Calcium Carbonate-Vitamin D 600-125 MG-UNIT TABS Take 1 tablet by mouth daily. Benjamine Sprague, DO Reordered  Ordered as: Calcium Carbonate-Vitamin D 600-125 MG-UNIT TABS 1 tablet - 1 tablet, Oral, Daily, First dose on Thu 05/08/19 at 1000  clonazePAM (KLONOPIN) 0.5 MG tablet Take 0.25 mg by mouth daily as needed for anxiety.  Lysle Pearl, Reniyah Gootee, DO Reordered  Ordered as: clonazePAM [facility]) tablet 0.25 mg - 0.25 mg, Oral, Daily PRN, anxiety, Starting Wed 05/07/19 at 2209  cyclobenzaprine (FLEXERIL) 10 MG tablet Take 10 mg by mouth at bedtime.  Lysle Pearl, Khaniya Tenaglia, DO Reordered  Ordered as: cyclobenzaprine (FLEXERIL) tablet 10 mg - 10 mg, Oral, Daily at bedtime, First dose on Wed 05/07/19 at 2215  gabapentin (NEURONTIN) 600 MG tablet Take 600 mg by mouth 4 (four) times daily. Lysle Pearl, Ayden Apodaca, DO Reordered  Ordered as: gabapentin (NEURONTIN) tablet 600 mg - 600 mg, Oral, 4 times daily, First dose on Wed 05/07/19 at 2215  morphine (MS CONTIN) 15 MG 12 hr tablet Take 15 mg by mouth every 8 (eight) hours. Lysle Pearl, Ruffus Kamaka, DO Reordered  Ordered as: morphine (MS CONTIN) 12 hr tablet 15 mg - Sustained Release Morphine. Do NOT Crush. 15 mg, Oral, Every 8 hours, First dose on Wed 05/07/19 at 2215  Multiple Vitamin (MULTIVITAMIN WITH MINERALS) TABS tablet Take 1 tablet by mouth daily. Benjamine Sprague, DO Reordered  Ordered as: multivitamin with minerals tablet 1 tablet - 1 tablet, Oral, Daily, First dose on Thu 05/08/19 at 1000  pantoprazole (PROTONIX) 40 MG tablet Take 40 mg by mouth daily. Lysle Pearl, Eretria Manternach, DO Reordered  Ordered as: pantoprazole (PROTONIX) EC tablet 40 mg - 40 mg, Oral, Daily, First dose on Thu 05/08/19 at 1000  simvastatin (ZOCOR) 40 MG tablet Take 40 mg by mouth at bedtime.  Lysle Pearl, Boss Danielsen, DO Reordered  Ordered as: simvastatin (ZOCOR) tablet 40 mg - 40 mg, Oral, Daily at bedtime, First dose on Wed 05/07/19 at 2215  varenicline (CHANTIX)  1 MG tablet Take 1 mg by mouth 2 (two) times daily. Lysle Pearl, Sid Greener, DO Reordered  Ordered as: varenicline (CHANTIX) tablet 1 mg - 1 mg, Oral, 2 times daily, First dose on Wed 05/07/19 at [place].   Allergies:  Allergies as of 05/07/2019  . (No Known Allergies)    ROS:  General: Denies weight loss, weight gain, fatigue, fevers, chills, and night sweats. Eyes: Denies blurry vision, double vision, eye pain, itchy eyes, and tearing. Ears: Denies hearing loss, earache, and ringing in ears. Nose: Denies sinus pain, congestion, infections, runny nose, and nosebleeds. Mouth/throat: Denies hoarseness, sore throat, bleeding gums, and difficulty swallowing. Heart: Denies chest pain, palpitations, racing heart, irregular heartbeat, leg pain or swelling, and decreased activity tolerance. Respiratory: Denies breathing difficulty, shortness of breath, wheezing, cough, and sputum. GI: Denies change in appetite, heartburn, nausea, vomiting, constipation, diarrhea, and blood in stool. GU: Denies difficulty urinating, pain with urinating, urgency, frequency, blood in urine. Musculoskeletal: Denies joint stiffness, pain, swelling, muscle weakness. Skin: Denies rash, itching, mass, tumors, sores, and boils Neurologic: Denies headache, fainting, dizziness, seizures, numbness, and tingling. Psychiatric: Denies depression, anxiety, difficulty sleeping, and memory loss. Endocrine: Denies heat or cold intolerance, and increased thirst or urination. Blood/lymph: Denies easy bruising, and swollen glands     Objective:     BP 127/72   Pulse (!) 111   Temp 100 F (37.8 C) (Oral)   Resp 15   Ht 5\' 5"  (1.651 m)   Wt 64 kg   SpO2 97%   BMI 23.48 kg/m    Constitutional :  alert, cooperative, appears stated age and no distress  Lymphatics/Throat:  no asymmetry, masses, or scars  Respiratory:  clear to auscultation bilaterally  Cardiovascular:  tachycardic   Gastrointestinal: soft, no guarding, focal TTP in RUQ.   Musculoskeletal: Steady movement  Skin: Cool and moist  Psychiatric: Normal affect, non-agitated, not confused       LABS:  CMP Latest Ref Rng & Units 05/07/2019  Glucose 70 - 99 mg/dL 141(H)  BUN 8 - 23 mg/dL 7(L)  Creatinine 0.44 - 1.00 mg/dL 0.68  Sodium 135 - 145 mmol/L 134(L)  Potassium 3.5 - 5.1 mmol/L 3.4(L)  Chloride 98 - 111 mmol/L 99  CO2 22 - 32 mmol/L 25  Calcium 8.9 - 10.3 mg/dL 8.5(L)  Total Protein 6.5 - 8.1 g/dL 6.6  Total Bilirubin 0.3 - 1.2 mg/dL 0.5  Alkaline Phos 38 - 126 U/L 117  AST 15 - 41 U/L 33  ALT 0 - 44 U/L 31   CBC Latest Ref Rng & Units 05/07/2019  WBC 4.0 - 10.5 K/uL 15.0(H)  Hemoglobin 12.0 - 15.0 g/dL 14.1  Hematocrit 36.0 - 46.0 % 42.4  Platelets 150 - 400 K/uL 208     RADS: CLINICAL DATA:  Fever, abnormal labs, history of left breast cancer status post mastectomy  EXAM: CT ABDOMEN AND PELVIS WITH CONTRAST  TECHNIQUE: Multidetector CT imaging of the abdomen and pelvis was performed using the standard protocol following bolus administration of intravenous contrast.  CONTRAST:  131mL OMNIPAQUE IOHEXOL 300 MG/ML  SOLN  COMPARISON:  None.  FINDINGS: Lower chest: Lung bases are clear.  Hepatobiliary: There is diffuse gallbladder wall  thickening, with noncalcified gallstones versus biliary sludge. Findings are diagnostic of acute cholecystitis.  There is geographic fatty infiltration of the liver. No biliary dilation.  Pancreas: Unremarkable. No pancreatic ductal dilatation or surrounding inflammatory changes.  Spleen: Normal in size without focal abnormality.  Adrenals/Urinary Tract: Adrenal glands are unremarkable. Kidneys are normal, without renal calculi, focal lesion, or hydronephrosis. Bladder is unremarkable.  Stomach/Bowel: No bowel obstruction or ileus. Normal retrocecal appendix. No bowel wall thickening or inflammatory  change.  Vascular/Lymphatic: Aortic atherosclerosis. No enlarged abdominal or pelvic lymph nodes.  Reproductive: Status post hysterectomy. No adnexal masses.  Other: No abdominal wall hernia or abnormality. No abdominopelvic ascites.  Musculoskeletal: No acute or destructive bony lesions. Postsurgical changes are seen from lower lumbar fusion. Reconstructed images demonstrate no additional findings.  IMPRESSION: 1. Gallbladder wall thickening and noncalcified stones versus sludge, consistent with acute cholecystitis.   Electronically Signed   By: Randa Ngo M.D.   On: 05/07/2019 20:24  Assessment:      Acute cholecystitis  Plan:     Due to the high dose aspirin use with BC ingestion patient is currently at high risk of intraoperative bleeding if we proceed with lap chole today.  Although she has documented fever and tachycardia, proceed with IV antibiotics to see if these resolve and continue to monitor closely with serial abdominal exams.  Depending on how she responds to the IV antibiotics, may need to consider interval acute cholecystectomy because by the time [facility] powder wears off (3-5days), we are beyon the 72hr mark since presentation that is known to increase perioperative risk for lap chole.  Discussed this with patient and she verbalized understanding, agreeable to continue IV abx and observe for now.  All questions addressed.  Will continue home meds in the meantime, including maintenance oral morphine for her back.  Clear liquid diet ok. Will hold DVT prophylaxis for now.

## 2019-05-07 NOTE — ED Triage Notes (Signed)
Patient in today c/o fever (99-100) x 2 days. Patient states she had some shoulder and back pain on Monday (05/05/19) and abdominal this morning. Patient states she is not having any pain now. Patient has taken OTC BC Powders, last dose last night at ~7pm.

## 2019-05-08 ENCOUNTER — Other Ambulatory Visit: Payer: Medicare Other

## 2019-05-08 LAB — SARS CORONAVIRUS 2 (TAT 6-24 HRS): SARS Coronavirus 2: NEGATIVE

## 2019-05-08 LAB — BASIC METABOLIC PANEL
Anion gap: 7 (ref 5–15)
BUN: 6 mg/dL — ABNORMAL LOW (ref 8–23)
CO2: 27 mmol/L (ref 22–32)
Calcium: 7.9 mg/dL — ABNORMAL LOW (ref 8.9–10.3)
Chloride: 104 mmol/L (ref 98–111)
Creatinine, Ser: 0.67 mg/dL (ref 0.44–1.00)
GFR calc Af Amer: >60 mL/min (ref >60)
GFR calc non Af Amer: >60 mL/min (ref >60)
Glucose, Bld: 106 mg/dL — ABNORMAL HIGH (ref 70–99)
Potassium: 3.5 mmol/L (ref 3.5–5.1)
Sodium: 138 mmol/L (ref 135–145)

## 2019-05-08 LAB — CBC
HCT: 35.8 % — ABNORMAL LOW (ref 36.0–46.0)
Hemoglobin: 11.9 g/dL — ABNORMAL LOW (ref 12.0–15.0)
MCH: 33.1 pg (ref 26.0–34.0)
MCHC: 33.2 g/dL (ref 30.0–36.0)
MCV: 99.7 fL (ref 80.0–100.0)
Platelets: 205 10*3/uL (ref 150–400)
RBC: 3.59 MIL/uL — ABNORMAL LOW (ref 3.87–5.11)
RDW: 12.2 % (ref 11.5–15.5)
WBC: 13.8 10*3/uL — ABNORMAL HIGH (ref 4.0–10.5)
nRBC: 0 % (ref 0.0–0.2)

## 2019-05-08 LAB — MAGNESIUM: Magnesium: 1.7 mg/dL (ref 1.7–2.4)

## 2019-05-08 LAB — HEPATIC FUNCTION PANEL
ALT: 21 U/L (ref 0–44)
AST: 23 U/L (ref 15–41)
Albumin: 2.7 g/dL — ABNORMAL LOW (ref 3.5–5.0)
Alkaline Phosphatase: 97 U/L (ref 38–126)
Bilirubin, Direct: 0.3 mg/dL — ABNORMAL HIGH (ref 0.0–0.2)
Indirect Bilirubin: 0.7 mg/dL (ref 0.3–0.9)
Total Bilirubin: 1 mg/dL (ref 0.3–1.2)
Total Protein: 5.6 g/dL — ABNORMAL LOW (ref 6.5–8.1)

## 2019-05-08 LAB — HIV ANTIBODY (ROUTINE TESTING W REFLEX): HIV Screen 4th Generation wRfx: NONREACTIVE

## 2019-05-08 LAB — PHOSPHORUS: Phosphorus: 3.5 mg/dL (ref 2.5–4.6)

## 2019-05-08 MED ORDER — MORPHINE SULFATE ER 15 MG PO TBCR: 15.0000 mg | EXTENDED_RELEASE_TABLET | Freq: Three times a day (TID) | ORAL | Status: DC

## 2019-05-08 MED ORDER — MORPHINE SULFATE ER 15 MG PO TBCR
15.0000 mg | EXTENDED_RELEASE_TABLET | Freq: Three times a day (TID) | ORAL | Status: DC
  Filled 2019-05-08: qty 1

## 2019-05-08 MED ORDER — GABAPENTIN 600 MG PO TABS
600.0000 mg | ORAL_TABLET | Freq: Four times a day (QID) | ORAL | Status: DC
  Administered 2019-05-08 – 2019-05-09 (×4): 600 mg via ORAL
  Filled 2019-05-08 (×6): qty 1

## 2019-05-08 MED ORDER — SODIUM CHLORIDE 0.9 % IV SOLN
INTRAVENOUS | Status: DC | PRN
  Administered 2019-05-08: 14:00:00 500 mL via INTRAVENOUS

## 2019-05-08 MED ORDER — MORPHINE SULFATE ER 15 MG PO TBCR
15.0000 mg | EXTENDED_RELEASE_TABLET | Freq: Three times a day (TID) | ORAL | Status: DC
  Administered 2019-05-08 – 2019-05-09 (×3): 15 mg via ORAL
  Filled 2019-05-08 (×5): qty 1

## 2019-05-08 NOTE — ED Notes (Signed)
Patient oxygen levels noted at 91% room air while sleeping. Patient placed on 2L [place] and oxygen saturations improved to 98%

## 2019-05-09 LAB — BASIC METABOLIC PANEL
Anion gap: 8 (ref 5–15)
BUN: <5 mg/dL — ABNORMAL LOW (ref 8–23)
CO2: 27 mmol/L (ref 22–32)
Calcium: 8.1 mg/dL — ABNORMAL LOW (ref 8.9–10.3)
Chloride: 103 mmol/L (ref 98–111)
Creatinine, Ser: 0.71 mg/dL (ref 0.44–1.00)
GFR calc Af Amer: >60 mL/min (ref >60)
GFR calc non Af Amer: >60 mL/min (ref >60)
Glucose, Bld: 101 mg/dL — ABNORMAL HIGH (ref 70–99)
Potassium: 3.4 mmol/L — ABNORMAL LOW (ref 3.5–5.1)
Sodium: 138 mmol/L (ref 135–145)

## 2019-05-09 LAB — PHOSPHORUS: Phosphorus: 3.3 mg/dL (ref 2.5–4.6)

## 2019-05-09 LAB — HEPATIC FUNCTION PANEL
ALT: 20 U/L (ref 0–44)
AST: 20 U/L (ref 15–41)
Albumin: 2.7 g/dL — ABNORMAL LOW (ref 3.5–5.0)
Alkaline Phosphatase: 112 U/L (ref 38–126)
Bilirubin, Direct: 0.2 mg/dL (ref 0.0–0.2)
Indirect Bilirubin: 1 mg/dL — ABNORMAL HIGH (ref 0.3–0.9)
Total Bilirubin: 1.2 mg/dL (ref 0.3–1.2)
Total Protein: 5.6 g/dL — ABNORMAL LOW (ref 6.5–8.1)

## 2019-05-09 LAB — CBC
HCT: 35.6 % — ABNORMAL LOW (ref 36.0–46.0)
Hemoglobin: 11.7 g/dL — ABNORMAL LOW (ref 12.0–15.0)
MCH: 32.4 pg (ref 26.0–34.0)
MCHC: 32.9 g/dL (ref 30.0–36.0)
MCV: 98.6 fL (ref 80.0–100.0)
Platelets: 237 10*3/uL (ref 150–400)
RBC: 3.61 MIL/uL — ABNORMAL LOW (ref 3.87–5.11)
RDW: 12.2 % (ref 11.5–15.5)
WBC: 9.4 10*3/uL (ref 4.0–10.5)
nRBC: 0 % (ref 0.0–0.2)

## 2019-05-09 LAB — MAGNESIUM: Magnesium: 1.8 mg/dL (ref 1.7–2.4)

## 2019-05-09 MED ORDER — AMOXICILLIN-POT CLAVULANATE 875-125 MG PO TABS: 1.0000 | ORAL_TABLET | Freq: Two times a day (BID) | ORAL | 0 refills | Status: AC

## 2019-05-09 MED ORDER — MORPHINE SULFATE ER 15 MG PO TBCR: 15.0000 mg | EXTENDED_RELEASE_TABLET | Freq: Three times a day (TID) | ORAL | Status: DC

## 2019-05-09 NOTE — Progress Notes (Signed)
Pt discharged home.  Discharge instructions, prescriptions and follow up appointment given to and reviewed with pt.  Pt verbalized understanding.  Escorted by staff. 

## 2019-05-09 NOTE — Discharge Instructions (Addendum)
Cholecystitis  Cholecystitis is irritation and swelling (inflammation) of the gallbladder. The gallbladder is an organ that is shaped like a pear. It is under the liver on the right side of the body. This organ stores bile. Bile helps the body break down (digest) the fats in food. This condition can occur all of a sudden. It needs to be treated. What are the causes? This condition may be caused by stones or lumps that form in the gallbladder (gallstones). Gallstones can block the tube (duct) that carries bile out of your gallbladder. Other causes are:  Damage to the gallbladder due to less blood flow.  Germs in the bile ducts.  Scars or kinks in the bile ducts.  Abnormal growths (tumors) in the liver, pancreas, or gallbladder. What increases the risk? You are more likely to develop this condition if:  You have sickle cell disease.  You take birth control pills.  You use estrogen.  You have alcoholic liver disease.  You have liver cirrhosis.  You are being fed through a vein.  You are very ill.  You do not eat or drink for a long time. This is also called "fasting."  You are overweight (obese).  You lose weight too fast.  You are pregnant.  You have high levels of fat in the blood (triglycerides).  You have irritation and swelling of the pancreas (pancreatitis). What are the signs or symptoms? Symptoms of this condition include:  Pain in the belly (abdomen). Pain is often in the upper right area of the belly.  Tenderness or bloating in the belly.  Feeling sick to your stomach (nauseous).  Throwing up (vomiting).  Fever.  Chills. How is this diagnosed? This condition may be diagnosed with a medical history and exam. You may also have other tests, such as:  Imaging tests. This may include: ? Ultrasound. ? CT scan of the belly. ? Nuclear scan. This is also called a HIDA scan. This scan lets your doctor see the bile as it moves in your body. ? MRI.  Blood  tests. These are done to check: ? Your blood count. The white blood cell count may be higher than normal. ? How well your liver works. How is this treated? This condition may be treated with:  Surgery to take out your gallbladder.  Antibiotic medicines to treat illnesses caused by germs.  Going without food for some time.  Giving fluids through an IV tube.  Medicines to treat pain or throwing up. Follow these instructions at home:  If you had surgery, follow instructions from your doctor about how to care for yourself after you go home. Medicines   Take over-the-counter and prescription medicines only as told by your doctor.  If you were prescribed an antibiotic medicine, take it as told by your doctor. Do not stop taking it even if you start to feel better. General instructions  Follow instructions from your doctor about what to eat or drink. Do not eat or drink anything that makes you sick again.  Do not lift anything that is heavier than 10 lb (4.5 kg) until your doctor says that it is safe.  Do not use any products that contain nicotine or tobacco, such as cigarettes and e-cigarettes. If you need help quitting, ask your doctor.  Keep all follow-up visits as told by your doctor. This is important. Contact a doctor if:  You have pain and your medicine does not help.  You have a fever. Get help right away if:    Your pain moves to: ? Another part of your belly. ? Your back.  Your symptoms do not go away.  You have new symptoms. Summary  Cholecystitis is swelling and irritation of the gallbladder.  This condition may be caused by stones or lumps that form in the gallbladder (gallstones).  Common symptoms are pain in the belly. You may feel sick to your stomach and start throwing up. You may also have a fever and chills.  This condition may be treated with surgery to take out the gallbladder. It may also be treated with medicines, fasting, and fluids through an IV  tube.  Follow what you are told about eating and drinking. Do not eat things that make you sick again. This information is not intended to replace advice given to you by your health care provider. Make sure you discuss any questions you have with your health care provider. Document Revised: 06/08/2017 Document Reviewed: 06/08/2017 Elsevier Patient Education  2020 Elsevier Inc.   Gallbladder Eating Plan If you have a gallbladder condition, you may have trouble digesting fats. Eating a low-fat diet can help reduce your symptoms, and may be helpful before and after having surgery to remove your gallbladder (cholecystectomy). Your health care provider may recommend that you work with a diet and nutrition specialist (dietitian) to help you reduce the amount of fat in your diet. What are tips for following this plan? General guidelines  Limit your fat intake to less than 30% of your total daily calories. If you eat around 1,800 calories each day, this is less than 60 grams (g) of fat per day.  Fat is an important part of a healthy diet. Eating a low-fat diet can make it hard to maintain a healthy body weight. Ask your dietitian how much fat, calories, and other nutrients you need each day.  Eat small, frequent meals throughout the day instead of three large meals.  Drink at least 8-10 cups of fluid a day. Drink enough fluid to keep your urine clear or pale yellow.  Limit alcohol intake to no more than 1 drink a day for nonpregnant women and 2 drinks a day for men. One drink equals 12 oz of beer, 5 oz of wine, or 1 oz of hard liquor. Reading food labels  Check Nutrition Facts on food labels for the amount of fat per serving. Choose foods with less than 3 grams of fat per serving. Shopping  Choose nonfat and low-fat healthy foods. Look for the words "nonfat," "low fat," or "fat free."  Avoid buying processed or prepackaged foods. Cooking  Cook using low-fat methods, such as baking,  broiling, grilling, or boiling.  Cook with small amounts of healthy fats, such as olive oil, grapeseed oil, canola oil, or sunflower oil. What foods are recommended?   All fresh, frozen, or canned fruits and vegetables.  Whole grains.  Low-fat or non-fat (skim) milk and yogurt.  Lean meat, skinless poultry, fish, eggs, and beans.  Low-fat protein supplement powders or drinks.  Spices and herbs. What foods are not recommended?  High-fat foods. These include baked goods, fast food, fatty cuts of meat, ice cream, french toast, sweet rolls, pizza, cheese bread, foods covered with butter, creamy sauces, or cheese.  Fried foods. These include french fries, tempura, battered fish, breaded chicken, fried breads, and sweets.  Foods with strong odors.  Foods that cause bloating and gas. Summary  A low-fat diet can be helpful if you have a gallbladder condition, or before and after gallbladder   surgery.  Limit your fat intake to less than 30% of your total daily calories. This is about 60 g of fat if you eat 1,800 calories each day.  Eat small, frequent meals throughout the day instead of three large meals. This information is not intended to replace advice given to you by your health care provider. Make sure you discuss any questions you have with your health care provider. Document Revised: 05/23/2018 Document Reviewed: 03/09/2016 Elsevier Patient Education  2020 Elsevier Inc.  

## 2019-05-09 NOTE — Discharge Summary (Signed)
Physician Discharge Summary  Patient ID: Terri James MRN: [id number] DOB/AGE: 68-Nov-1952 68 y.o.  Admit date: 05/07/2019 Discharge date: 05/09/2019  Admission Diagnoses: Acute cholecystitis  Discharge Diagnoses:  Same as above  Discharged Condition: good  Hospital Course: Admitted for above.  Due to the recent use of BC powder, decision was made to proceed with antibiotic treatment alone to see if any improvement.  Patient markedly improved, with normalized white count and minimal pain on exam.  Tolerated regular diet at time of discharge.  Discussed with patient interval cholecystectomy as long as pain remains minimal with no evidence of recurrence.   Consults: None  Discharge Exam: Blood pressure 104/61, pulse 91, temperature 98.4 F (36.9 C), temperature source Oral, resp. rate 18, height 5\' 5"  (1.651 m), weight 64 kg, SpO2 (!) 87 %. General appearance: alert, cooperative and no distress GI: soft, non-tender; bowel sounds normal; no masses,  no organomegaly  Disposition:  Discharge disposition: 01-Home or Self Care        Allergies as of 05/09/2019   No Known Allergies     Medication List    TAKE these medications   acetaminophen 325 MG tablet Commonly known as: TYLENOL Take 650 mg by mouth 2 (two) times daily as needed for moderate pain or headache.   amoxicillin-clavulanate 875-125 MG tablet Commonly known as: Augmentin Take 1 tablet by mouth 2 (two) times daily for 7 days.   Calcium Carbonate-Vitamin D 600-125 MG-UNIT Tabs Take 1 tablet by mouth daily.   clonazePAM 0.5 MG tablet Commonly known as: KLONOPIN Take 0.25 mg by mouth daily as needed for anxiety.   cyclobenzaprine 10 MG tablet Commonly known as: FLEXERIL Take 10 mg by mouth at bedtime.   gabapentin 600 MG tablet Commonly known as: NEURONTIN Take 600 mg by mouth 4 (four) times daily.   morphine 15 MG 12 hr tablet Commonly known as: MS CONTIN Take 15 mg by mouth every 8 (eight)  hours.   multivitamin with minerals Tabs tablet Take 1 tablet by mouth daily.   pantoprazole 40 MG tablet Commonly known as: PROTONIX Take 40 mg by mouth daily.   simvastatin 40 MG tablet Commonly known as: ZOCOR Take 40 mg by mouth at bedtime.   varenicline 1 MG tablet Commonly known as: CHANTIX Take 1 mg by mouth 2 (two) times daily.      Follow-up Information    Terri James, Terri Marlette, DO Follow up in 2 week(s).   Specialty: Surgery Contact information: [place] [address] [phone]            Total time spent arranging discharge was >14min. Signed: Benjamine James 05/09/2019, 10:36 PM

## 2019-05-09 NOTE — Care Management Important Message (Signed)
Important Message  Patient Details  Name: Terri James MRN: [id number] Date of Birth: 06-10-1950   Medicare Important Message Given:  Yes     Dannette Barbara 05/09/2019, 12:35 PM

## 2019-05-12 ENCOUNTER — Inpatient Hospital Stay
Admission: EM | Admit: 2019-05-12 | Discharge: 2019-05-15 | DRG: 445 | Disposition: A | Discharge status: Home / Self Care | Payer: Medicare Other | Attending: Surgery | Admitting: Surgery

## 2019-05-12 ENCOUNTER — Emergency Department: Payer: Medicare Other

## 2019-05-12 ENCOUNTER — Other Ambulatory Visit: Payer: Self-pay

## 2019-05-12 ENCOUNTER — Encounter: Payer: Self-pay | Admitting: Emergency Medicine

## 2019-05-12 DIAGNOSIS — F329 Major depressive disorder, single episode, unspecified: Secondary | ICD-10-CM | POA: Diagnosis present

## 2019-05-12 DIAGNOSIS — F17210 Nicotine dependence, cigarettes, uncomplicated: Secondary | ICD-10-CM | POA: Diagnosis present

## 2019-05-12 DIAGNOSIS — K8020 Calculus of gallbladder without cholecystitis without obstruction: Secondary | ICD-10-CM

## 2019-05-12 DIAGNOSIS — Z20822 Contact with and (suspected) exposure to covid-19: Secondary | ICD-10-CM | POA: Diagnosis present

## 2019-05-12 DIAGNOSIS — Z853 Personal history of malignant neoplasm of breast: Secondary | ICD-10-CM

## 2019-05-12 DIAGNOSIS — G8929 Other chronic pain: Secondary | ICD-10-CM | POA: Diagnosis present

## 2019-05-12 DIAGNOSIS — K219 Gastro-esophageal reflux disease without esophagitis: Secondary | ICD-10-CM | POA: Diagnosis present

## 2019-05-12 DIAGNOSIS — Z79891 Long term (current) use of opiate analgesic: Secondary | ICD-10-CM

## 2019-05-12 DIAGNOSIS — B9620 Unspecified Escherichia coli [E. coli] as the cause of diseases classified elsewhere: Secondary | ICD-10-CM | POA: Diagnosis present

## 2019-05-12 DIAGNOSIS — Z923 Personal history of irradiation: Secondary | ICD-10-CM | POA: Diagnosis not present

## 2019-05-12 DIAGNOSIS — J449 Chronic obstructive pulmonary disease, unspecified: Secondary | ICD-10-CM | POA: Diagnosis present

## 2019-05-12 DIAGNOSIS — B961 Klebsiella pneumoniae [K. pneumoniae] as the cause of diseases classified elsewhere: Secondary | ICD-10-CM | POA: Diagnosis present

## 2019-05-12 DIAGNOSIS — M1990 Unspecified osteoarthritis, unspecified site: Secondary | ICD-10-CM | POA: Diagnosis present

## 2019-05-12 DIAGNOSIS — M810 Age-related osteoporosis without current pathological fracture: Secondary | ICD-10-CM | POA: Diagnosis present

## 2019-05-12 DIAGNOSIS — Z1611 Resistance to penicillins: Secondary | ICD-10-CM | POA: Diagnosis present

## 2019-05-12 DIAGNOSIS — H9190 Unspecified hearing loss, unspecified ear: Secondary | ICD-10-CM | POA: Diagnosis present

## 2019-05-12 DIAGNOSIS — E119 Type 2 diabetes mellitus without complications: Secondary | ICD-10-CM | POA: Diagnosis present

## 2019-05-12 DIAGNOSIS — Z79899 Other long term (current) drug therapy: Secondary | ICD-10-CM

## 2019-05-12 DIAGNOSIS — K8012 Calculus of gallbladder with acute and chronic cholecystitis without obstruction: Secondary | ICD-10-CM | POA: Diagnosis present

## 2019-05-12 DIAGNOSIS — Z9221 Personal history of antineoplastic chemotherapy: Secondary | ICD-10-CM

## 2019-05-12 DIAGNOSIS — M545 Low back pain: Secondary | ICD-10-CM | POA: Diagnosis present

## 2019-05-12 DIAGNOSIS — E785 Hyperlipidemia, unspecified: Secondary | ICD-10-CM | POA: Diagnosis present

## 2019-05-12 DIAGNOSIS — Z23 Encounter for immunization: Secondary | ICD-10-CM

## 2019-05-12 DIAGNOSIS — Z8249 Family history of ischemic heart disease and other diseases of the circulatory system: Secondary | ICD-10-CM | POA: Diagnosis not present

## 2019-05-12 DIAGNOSIS — R1011 Right upper quadrant pain: Secondary | ICD-10-CM

## 2019-05-12 DIAGNOSIS — K819 Cholecystitis, unspecified: Secondary | ICD-10-CM

## 2019-05-12 DIAGNOSIS — K810 Acute cholecystitis: Secondary | ICD-10-CM

## 2019-05-12 DIAGNOSIS — Z833 Family history of diabetes mellitus: Secondary | ICD-10-CM | POA: Diagnosis not present

## 2019-05-12 LAB — CBC
HCT: 44.1 % (ref 36.0–46.0)
Hemoglobin: 14.1 g/dL (ref 12.0–15.0)
MCH: 32.3 pg (ref 26.0–34.0)
MCHC: 32 g/dL (ref 30.0–36.0)
MCV: 100.9 fL — ABNORMAL HIGH (ref 80.0–100.0)
Platelets: 371 10*3/uL (ref 150–400)
RBC: 4.37 MIL/uL (ref 3.87–5.11)
RDW: 12.1 % (ref 11.5–15.5)
WBC: 11.3 10*3/uL — ABNORMAL HIGH (ref 4.0–10.5)
nRBC: 0 % (ref 0.0–0.2)

## 2019-05-12 LAB — URINALYSIS, COMPLETE (UACMP) WITH MICROSCOPIC
Bacteria, UA: NONE SEEN
Bilirubin Urine: NEGATIVE
Glucose, UA: NEGATIVE mg/dL
Hgb urine dipstick: NEGATIVE
Ketones, ur: NEGATIVE mg/dL
Leukocytes,Ua: NEGATIVE
Nitrite: NEGATIVE
Protein, ur: 30 mg/dL — AB
Specific Gravity, Urine: 1.027 (ref 1.005–1.030)
pH: 5 (ref 5.0–8.0)

## 2019-05-12 LAB — COMPREHENSIVE METABOLIC PANEL
ALT: 78 U/L — ABNORMAL HIGH (ref 0–44)
AST: 35 U/L (ref 15–41)
Albumin: 3.4 g/dL — ABNORMAL LOW (ref 3.5–5.0)
Alkaline Phosphatase: 287 U/L — ABNORMAL HIGH (ref 38–126)
Anion gap: 8 (ref 5–15)
BUN: 6 mg/dL — ABNORMAL LOW (ref 8–23)
CO2: 28 mmol/L (ref 22–32)
Calcium: 8.5 mg/dL — ABNORMAL LOW (ref 8.9–10.3)
Chloride: 100 mmol/L (ref 98–111)
Creatinine, Ser: 0.59 mg/dL (ref 0.44–1.00)
GFR calc Af Amer: >60 mL/min (ref >60)
GFR calc non Af Amer: >60 mL/min (ref >60)
Glucose, Bld: 166 mg/dL — ABNORMAL HIGH (ref 70–99)
Potassium: 3.4 mmol/L — ABNORMAL LOW (ref 3.5–5.1)
Sodium: 136 mmol/L (ref 135–145)
Total Bilirubin: 0.4 mg/dL (ref 0.3–1.2)
Total Protein: 7.2 g/dL (ref 6.5–8.1)

## 2019-05-12 LAB — RESPIRATORY PANEL BY RT PCR (FLU A&B, COVID)
Influenza A by PCR: NEGATIVE
Influenza B by PCR: NEGATIVE
SARS Coronavirus 2 by RT PCR: NEGATIVE

## 2019-05-12 LAB — LIPASE, BLOOD: Lipase: 15 U/L (ref 11–51)

## 2019-05-12 MED ORDER — CLONAZEPAM 0.5 MG PO TABS: 0.2500 mg | ORAL_TABLET | Freq: Every day | ORAL | Status: DC | PRN

## 2019-05-12 MED ORDER — CYCLOBENZAPRINE HCL 10 MG PO TABS
10.0000 mg | ORAL_TABLET | Freq: Every day | ORAL | Status: DC
  Administered 2019-05-12 – 2019-05-14 (×3): 10 mg via ORAL
  Filled 2019-05-12 (×3): qty 1

## 2019-05-12 MED ORDER — FENTANYL CITRATE (PF) 100 MCG/2ML IJ SOLN
25.0000 ug | Freq: Once | INTRAMUSCULAR | Status: AC
  Administered 2019-05-12: 25 ug via INTRAVENOUS
  Filled 2019-05-12: qty 2

## 2019-05-12 MED ORDER — ONDANSETRON 4 MG PO TBDP: 4.0000 mg | ORAL_TABLET | Freq: Four times a day (QID) | ORAL | Status: DC | PRN

## 2019-05-12 MED ORDER — ONDANSETRON HCL 4 MG/2ML IJ SOLN: 4.0000 mg | Freq: Four times a day (QID) | INTRAMUSCULAR | Status: DC | PRN

## 2019-05-12 MED ORDER — SIMVASTATIN 40 MG PO TABS
40.0000 mg | ORAL_TABLET | Freq: Every day | ORAL | Status: DC
  Administered 2019-05-12 – 2019-05-14 (×3): 40 mg via ORAL
  Filled 2019-05-12 (×4): qty 1

## 2019-05-12 MED ORDER — PANTOPRAZOLE SODIUM 40 MG PO TBEC
40.0000 mg | DELAYED_RELEASE_TABLET | Freq: Every day | ORAL | Status: DC
  Administered 2019-05-13 – 2019-05-15 (×3): 40 mg via ORAL
  Filled 2019-05-12 (×3): qty 1

## 2019-05-12 MED ORDER — PNEUMOCOCCAL VAC POLYVALENT 25 MCG/0.5ML IJ INJ
0.5000 mL | INJECTION | INTRAMUSCULAR | Status: AC
  Administered 2019-05-15: 09:00:00 0.5 mL via INTRAMUSCULAR
  Filled 2019-05-12: qty 0.5

## 2019-05-12 MED ORDER — METOPROLOL TARTRATE 5 MG/5ML IV SOLN: 5.0000 mg | Freq: Four times a day (QID) | INTRAVENOUS | Status: DC | PRN

## 2019-05-12 MED ORDER — SODIUM CHLORIDE 0.9 % IV SOLN
INTRAVENOUS | Status: DC | PRN
  Administered 2019-05-12 – 2019-05-13 (×2): 10 mL via INTRAVENOUS
  Administered 2019-05-14 – 2019-05-15 (×2): 250 mL via INTRAVENOUS

## 2019-05-12 MED ORDER — GABAPENTIN 600 MG PO TABS
600.0000 mg | ORAL_TABLET | Freq: Four times a day (QID) | ORAL | Status: DC
  Administered 2019-05-12 – 2019-05-15 (×10): 600 mg via ORAL
  Filled 2019-05-12 (×10): qty 1

## 2019-05-12 MED ORDER — HYDROMORPHONE HCL 1 MG/ML IJ SOLN
0.5000 mg | INTRAMUSCULAR | Status: DC | PRN
  Administered 2019-05-12 – 2019-05-14 (×7): 1 mg via INTRAVENOUS
  Filled 2019-05-12 (×8): qty 1

## 2019-05-12 MED ORDER — ONDANSETRON HCL 4 MG/2ML IJ SOLN
4.0000 mg | Freq: Once | INTRAMUSCULAR | Status: AC
  Administered 2019-05-12: 4 mg via INTRAVENOUS
  Filled 2019-05-12: qty 2

## 2019-05-12 MED ORDER — PIPERACILLIN-TAZOBACTAM 3.375 G IVPB
3.3750 g | Freq: Three times a day (TID) | INTRAVENOUS | Status: DC
  Administered 2019-05-12 – 2019-05-15 (×9): 3.375 g via INTRAVENOUS
  Filled 2019-05-12 (×9): qty 50

## 2019-05-12 MED ORDER — POTASSIUM CHLORIDE IN NACL 20-0.9 MEQ/L-% IV SOLN
INTRAVENOUS | Status: DC
  Filled 2019-05-12 (×9): qty 1000

## 2019-05-12 MED ORDER — ACETAMINOPHEN 325 MG PO TABS
650.0000 mg | ORAL_TABLET | Freq: Two times a day (BID) | ORAL | Status: DC | PRN
  Administered 2019-05-13: 650 mg via ORAL
  Filled 2019-05-12: qty 2

## 2019-05-12 NOTE — Progress Notes (Signed)
IV fluids started. NPO sips with meds. The patient continues to complain of RUQ pain. IV pain meds provided.

## 2019-05-12 NOTE — ED Provider Notes (Signed)
[facility] Emergency Department Provider Note  ____________________________________________   First MD Initiated Contact with Patient 05/12/19 1226     (approximate)  I have reviewed the triage vital signs and the nursing notes.   HISTORY  Chief Complaint Abdominal Pain    HPI Terri James is a 69 y.o. female with recent admission for acute cholecystitis that was being treated with antibiotics only due to her recent use of BC powder.  Patient was discharged on 3/26 and had resolution of her white count and minimal pain upon examination.  Patient was discharged on Augmentin for 7 days.  Patient comes back in today for increased abdominal pain.  Patient stated that the pain started in the right upper quadrant and the right back last night, moderate, constant, nothing makes it better, nothing makes it worse.  Patient has been compliant with the Augmentin.  Has had a little bit of nausea with some dry heaving.  A little bit of diarrhea but seems to be resolving.  Denies any urinary symptoms.  States that this is the same pain that she came in with last time.  Denies any fevers.          Past Medical History:  Diagnosis Date  . Arthritis   . Cancer [facility]) 2003   left breast  . Chronic low back pain    continuous use of opioids  . COPD (chronic obstructive pulmonary disease) ([place])   . Depression   . Diabetes mellitus without complication (HCC)    diet controlled  . GERD (gastroesophageal reflux disease)   . History of hiatal hernia   . HOH (hard of hearing)   . Hyperlipidemia   . Left knee pain   . Osteoporosis   . Personal history of chemotherapy 2003   Left breast ca  . Personal history of radiation therapy 2003   left breast ca  . Pneumonia     Patient Active Problem List   Diagnosis Date Noted  . Acute cholecystitis 05/07/2019    Past Surgical History:  Procedure Laterality Date  . ABDOMINAL HYSTERECTOMY    . BACK SURGERY  2002   fusion with screws, second surgery/  . BREAST EXCISIONAL BIOPSY Left 2003   positive  . BREAST LUMPECTOMY Left 2003   f/u with chemo and radiation   . CATARACT EXTRACTION W/PHACO Right 08/24/2015   Procedure: CATARACT EXTRACTION PHACO AND INTRAOCULAR LENS PLACEMENT (IOC);  Surgeon: Birder Robson, MD;  Location: [facility] ORS;  Service: Ophthalmology;  Laterality: Right;  [place] 1.02 AP% 18.2 CDE 11.28 Fluid Pack lot # [id number]  . CATARACT EXTRACTION W/PHACO Left 05/09/2016   Procedure: CATARACT EXTRACTION PHACO AND INTRAOCULAR LENS PLACEMENT (IOC);  Surgeon: Birder Robson, MD;  Location: [facility] ORS;  Service: Ophthalmology;  Laterality: Left;  [place] 00:49 AP% 17.2 CDE 8.45 Fluid pack lot # [id number]  . COLONOSCOPY WITH PROPOFOL N/A 08/06/2015   Procedure: COLONOSCOPY WITH PROPOFOL;  Surgeon: Manya Silvas, MD;  Location: [facility] ENDOSCOPY;  Service: Endoscopy;  Laterality: N/A;  . DILATION AND CURETTAGE OF UTERUS    . ESOPHAGOGASTRODUODENOSCOPY (EGD) WITH PROPOFOL N/A 08/06/2015   Procedure: ESOPHAGOGASTRODUODENOSCOPY (EGD) WITH PROPOFOL;  Surgeon: Manya Silvas, MD;  Location: [facility] ENDOSCOPY;  Service: Endoscopy;  Laterality: N/A;  . ganglion cyst removal Left   . POSTERIOR FUSION LUMBAR SPINE     x2  . TONSILLECTOMY      Prior to Admission medications   Medication Sig Start Date End Date Taking? Authorizing Provider  acetaminophen (TYLENOL) 325 MG tablet Take 650 mg by mouth 2 (two) times daily as needed for moderate pain or headache.     [provider]  amoxicillin-clavulanate (AUGMENTIN) 875-125 MG tablet Take 1 tablet by mouth 2 (two) times daily for 7 days. 05/09/19 05/16/19  Benjamine Sprague, DO  Calcium Carbonate-Vitamin D 600-125 MG-UNIT TABS Take 1 tablet by mouth daily.    [provider]  clonazePAM (KLONOPIN) 0.5 MG tablet Take 0.25 mg by mouth daily as needed for anxiety.     [provider]  cyclobenzaprine (FLEXERIL) 10 MG tablet Take 10 mg by mouth at  bedtime.     [provider]  gabapentin (NEURONTIN) 600 MG tablet Take 600 mg by mouth 4 (four) times daily.    [provider]  morphine (MS CONTIN) 15 MG 12 hr tablet Take 15 mg by mouth every 8 (eight) hours.    [provider]  Multiple Vitamin (MULTIVITAMIN WITH MINERALS) TABS tablet Take 1 tablet by mouth daily.    [provider]  pantoprazole (PROTONIX) 40 MG tablet Take 40 mg by mouth daily.    [provider]  simvastatin (ZOCOR) 40 MG tablet Take 40 mg by mouth at bedtime.     [provider]  varenicline (CHANTIX) 1 MG tablet Take 1 mg by mouth 2 (two) times daily.    [provider]    Allergies Patient has no known allergies.  Family History  Problem Relation Age of Onset  . Heart disease Mother   . Hypertension Mother   . Diabetes Father   . Macular degeneration Father   . Hypertension Father     Social History Social History   Tobacco Use  . Smoking status: Current Every Day Smoker    Packs/day: 0.50    Years: 46.00    Pack years: 23.00    Types: Cigarettes  . Smokeless tobacco: Never Used  Substance Use Topics  . Alcohol use: Yes  . Drug use: No      Review of Systems Constitutional: No fever/chills Eyes: No visual changes. ENT: No sore throat. Cardiovascular: Denies chest pain. Respiratory: Denies shortness of breath. Gastrointestinal: Positive abdominal pain, nausea, dry heaving, diarrhea Genitourinary: Negative for dysuria. Musculoskeletal: Positive upper back pain Skin: Negative for rash. Neurological: Negative for headaches, focal weakness or numbness. All other ROS negative ____________________________________________   PHYSICAL EXAM:  VITAL SIGNS: ED Triage Vitals [05/12/19 1148]  Enc Vitals Group     BP 104/62     Pulse Rate 92     Resp 16     Temp 98.1 F (36.7 C)     Temp Source Oral     SpO2 94 %     Weight 142 lb (64.4 kg)     Height '5\' 6"'  (1.676 m)     Head  Circumference      Peak Flow      Pain Score 10     Pain Loc      Pain Edu?      Excl. in [place]?     Constitutional: Alert and oriented. Well appearing and in no acute distress. Eyes: Conjunctivae are normal. EOMI. Head: Atraumatic. Nose: No congestion/rhinnorhea. Mouth/Throat: Mucous membranes are moist.   Neck: No stridor. Trachea Midline. FROM Cardiovascular: Normal rate, regular rhythm. Grossly normal heart sounds.  Good peripheral circulation. Respiratory: Normal respiratory effort.  No retractions. Lungs CTAB. Gastrointestinal: Tender in the right upper quadrant without rebound or guarding no distention. No abdominal  bruits.  Musculoskeletal: No lower extremity tenderness nor edema.  No joint effusions. Neurologic:  Normal speech and language. No gross focal neurologic deficits are appreciated.  Skin:  Skin is warm, dry and intact. No rash noted. Psychiatric: Mood and affect are normal. Speech and behavior are normal. GU: Deferred   ____________________________________________   LABS (all labs ordered are listed, but only abnormal results are displayed)  Labs Reviewed  COMPREHENSIVE METABOLIC PANEL - Abnormal; Notable for the following components:      Result Value   Potassium 3.4 (*)    Glucose, Bld 166 (*)    BUN 6 (*)    Calcium 8.5 (*)    Albumin 3.4 (*)    ALT 78 (*)    Alkaline Phosphatase 287 (*)    All other components within normal limits  CBC - Abnormal; Notable for the following components:   WBC 11.3 (*)    MCV 100.9 (*)    All other components within normal limits  LIPASE, BLOOD  URINALYSIS, COMPLETE (UACMP) WITH MICROSCOPIC   ____________________________________________ RADIOLOGY  Official radiology report(s): US ABDOMEN LIMITED RUQ  Result Date: 05/12/2019 CLINICAL DATA:  Right upper quadrant abdominal pain for 2 days. EXAM: ULTRASOUND ABDOMEN LIMITED RIGHT UPPER QUADRANT COMPARISON:  05/07/2019 CT abdomen/pelvis. FINDINGS: Gallbladder: Numerous  shadowing subcentimeter calcified gallstones in the gallbladder with moderate sludge. Marked diffuse gallbladder wall thickening (10 mm). No sonographic Murphy sign. No pericholecystic fluid. Common bile duct: Diameter: 5 mm Liver: Top-normal liver parenchymal echogenicity. Normal liver parenchymal echotexture. No liver masses. Portal vein is patent on color Doppler imaging with normal direction of blood flow towards the liver. Other: None. IMPRESSION: 1. Cholelithiasis, gallbladder sludge and prominent diffuse gallbladder wall thickening. No sonographic Murphy's sign or pericholecystic fluid. Sonographic findings are equivocal for acute versus chronic cholecystitis. Hepatobiliary scintigraphy study may be obtained for further evaluation as clinically warranted. 2. Normal caliber common bile duct (5 mm diameter). 3. Normal liver. Electronically Signed   By: Ilona Sorrel M.D.   On: 05/12/2019 14:03    ____________________________________________   PROCEDURES  Procedure(s) performed (including Critical Care):  Procedures   ____________________________________________   INITIAL IMPRESSION / ASSESSMENT AND PLAN / ED COURSE  SHATARRA WEHLING was evaluated in Emergency Department on 05/12/2019 for the symptoms described in the history of present illness. She was evaluated in the context of the global COVID-19 pandemic, which necessitated consideration that the patient might be at risk for infection with the SARS-CoV-2 virus that causes COVID-19. Institutional protocols and algorithms that pertain to the evaluation of patients at risk for COVID-19 are in a state of rapid change based on information released by regulatory bodies including the CDC and federal and state organizations. These policies and algorithms were followed during the patient's care in the ED.    Patient is a well-appearing 69 year old who comes in with right upper quadrant tenderness in the setting of being treated for acute  cholecystitis with antibiotics.  Will get labs to evaluate for patient's white count although patient is afebrile.  Will get labs to evaluate for her LFTs and lipase.  Patient has no lower abdominal tenderness to suggest appendicitis, SBO.  I suspect that this is most likely recurrent cholecystitis.  Will get labs to evaluate for electrolyte abnormalities, dehydration.  Patient has been off the [facility] powder.    Patient is white count is starting to trend back up from 9.4-11.3 Lipase is normal. LFTs have been downtrending but now are uptrending at ALT 78 and  alk phos of 287.  We will discuss with the surgery team to see if they want repeat imaging.  Dr. Celine Ahr recommends repeat ultrasound.  Patient ultrasound concerning for cholecystitis. Patient will be admitted to the surgery team. They are going to start antibiotics.        ____________________________________________   FINAL CLINICAL IMPRESSION(S) / ED DIAGNOSES   Final diagnoses:  RUQ pain  Cholecystitis      MEDICATIONS GIVEN DURING THIS VISIT:  Medications  fentaNYL (SUBLIMAZE) injection 25 mcg (25 mcg Intravenous Given 05/12/19 1314)  ondansetron (ZOFRAN) injection 4 mg (4 mg Intravenous Given 05/12/19 1314)     ED Discharge Orders    None       Note:  This document was prepared using Dragon voice recognition software and may include unintentional dictation errors.   Vanessa , MD 05/12/19 1414

## 2019-05-12 NOTE — ED Triage Notes (Signed)
Pt in via [facility] from home, complaints of RUQ abdominal pain and right flank pain x 2 days, some nausea, denies emesis, diarrhea.  Denies any urinary symptoms.  Vitals WDL.  NAD noted at this time.

## 2019-05-12 NOTE — ED Triage Notes (Signed)
Pt comes into the ED via EMS from home with c/o increased abd , was seen here on Friday and d/c with abx and dx of cholecystitis, RR19, HR 93, 97%RA, 132/72, 98.4.

## 2019-05-12 NOTE — H&P (Addendum)
[facility] SURGICAL HISTORY & PHYSICAL (cpt [phone])  HISTORY OF PRESENT ILLNESS (HPI):  69 y.o. female who was admitted to [facility] from 03/24 - 03/26 with acute cholecystitis which was managed conservatively by Dr Lysle Pearl with IV Abx. Surgical intervention was deferred due to recent ASA and BC powder use, which she has not taken since. She responded well to ABx and was discharged home on Augmentin. She reports that she was doing well the first few days after discharge. However last night she had a ham and cheese sub for dinner and around 10 PM last night she noticed the acute onset of RUQ abdominal discomfort. This was mild at first but progressively worsened. She described this as a sharp pain which was constant and radiated to her back and shoulder. Pain was similar to her pain on presentation on the 24th. She endorsed associated nausea. No fever, chills, cough, emesis, urinary changes, stool changes, or jaundice. No previous abdominal surgeries noted. Work up in the ED today was concerning for leukocytosis to 11K, alkaline phosphatase elevation to 287, normal bilirubin and lipase, and hypokalemia to 3.4. She had RUQ [place] which was concerning for possible acute on chronic cholecystitis.   General surgery is consulted by emergency medicine physician Dr Marjean Donna, MD for evaluation and management of cholecystitis.   PAST MEDICAL HISTORY (PMH):  Past Medical History:  Diagnosis Date  . Arthritis   . Cancer [facility]) 2003   left breast  . Chronic low back pain    continuous use of opioids  . COPD (chronic obstructive pulmonary disease) ([place])   . Depression   . Diabetes mellitus without complication (HCC)    diet controlled  . GERD (gastroesophageal reflux disease)   . History of hiatal hernia   . HOH (hard of hearing)   . Hyperlipidemia   . Left knee pain   . Osteoporosis   . Personal history of chemotherapy 2003   Left breast ca  . Personal history of radiation therapy 2003   left  breast ca  . Pneumonia     Reviewed. Otherwise negative.   PAST SURGICAL HISTORY ([place]):  Past Surgical History:  Procedure Laterality Date  . ABDOMINAL HYSTERECTOMY    . BACK SURGERY  2002   fusion with screws, second surgery/  . BREAST EXCISIONAL BIOPSY Left 2003   positive  . BREAST LUMPECTOMY Left 2003   f/u with chemo and radiation   . CATARACT EXTRACTION W/PHACO Right 08/24/2015   Procedure: CATARACT EXTRACTION PHACO AND INTRAOCULAR LENS PLACEMENT (IOC);  Surgeon: Birder Robson, MD;  Location: [facility] ORS;  Service: Ophthalmology;  Laterality: Right;  [place] 1.02 AP% 18.2 CDE 11.28 Fluid Pack lot # [id number]  . CATARACT EXTRACTION W/PHACO Left 05/09/2016   Procedure: CATARACT EXTRACTION PHACO AND INTRAOCULAR LENS PLACEMENT (IOC);  Surgeon: Birder Robson, MD;  Location: [facility] ORS;  Service: Ophthalmology;  Laterality: Left;  [place] 00:49 AP% 17.2 CDE 8.45 Fluid pack lot # [id number]  . COLONOSCOPY WITH PROPOFOL N/A 08/06/2015   Procedure: COLONOSCOPY WITH PROPOFOL;  Surgeon: Manya Silvas, MD;  Location: [facility] ENDOSCOPY;  Service: Endoscopy;  Laterality: N/A;  . DILATION AND CURETTAGE OF UTERUS    . ESOPHAGOGASTRODUODENOSCOPY (EGD) WITH PROPOFOL N/A 08/06/2015   Procedure: ESOPHAGOGASTRODUODENOSCOPY (EGD) WITH PROPOFOL;  Surgeon: Manya Silvas, MD;  Location: [facility] ENDOSCOPY;  Service: Endoscopy;  Laterality: N/A;  . ganglion cyst removal Left   . POSTERIOR FUSION LUMBAR SPINE     x2  . TONSILLECTOMY  Reviewed. Otherwise negative.   MEDICATIONS:  Prior to Admission medications   Medication Sig Start Date End Date Taking? Authorizing Provider  acetaminophen (TYLENOL) 325 MG tablet Take 650 mg by mouth 2 (two) times daily as needed for moderate pain or headache.     [provider]  amoxicillin-clavulanate (AUGMENTIN) 875-125 MG tablet Take 1 tablet by mouth 2 (two) times daily for 7 days. 05/09/19 05/16/19  Benjamine Sprague, DO  Calcium Carbonate-Vitamin D 600-125 MG-UNIT  TABS Take 1 tablet by mouth daily.    [provider]  clonazePAM (KLONOPIN) 0.5 MG tablet Take 0.25 mg by mouth daily as needed for anxiety.     [provider]  cyclobenzaprine (FLEXERIL) 10 MG tablet Take 10 mg by mouth at bedtime.     [provider]  gabapentin (NEURONTIN) 600 MG tablet Take 600 mg by mouth 4 (four) times daily.    [provider]  morphine (MS CONTIN) 15 MG 12 hr tablet Take 15 mg by mouth every 8 (eight) hours.    [provider]  Multiple Vitamin (MULTIVITAMIN WITH MINERALS) TABS tablet Take 1 tablet by mouth daily.    [provider]  pantoprazole (PROTONIX) 40 MG tablet Take 40 mg by mouth daily.    [provider]  simvastatin (ZOCOR) 40 MG tablet Take 40 mg by mouth at bedtime.     [provider]  varenicline (CHANTIX) 1 MG tablet Take 1 mg by mouth 2 (two) times daily.    [provider]     ALLERGIES:  No Known Allergies   SOCIAL HISTORY:  Social History   Socioeconomic History  . Marital status: Widowed    Spouse name: Not on file  . Number of children: Not on file  . Years of education: Not on file  . Highest education level: Not on file  Occupational History  . Not on file  Tobacco Use  . Smoking status: Current Every Day Smoker    Packs/day: 0.50    Years: 46.00    Pack years: 23.00    Types: Cigarettes  . Smokeless tobacco: Never Used  Substance and Sexual Activity  . Alcohol use: Yes  . Drug use: No  . Sexual activity: Never  Other Topics Concern  . Not on file  Social History Narrative  . Not on file   Social Determinants of Health   Financial Resource Strain:   . Difficulty of Paying Living Expenses:   Food Insecurity:   . Worried About Charity fundraiser in the Last Year:   . Arboriculturist in the Last Year:   Transportation Needs:   . Film/video editor (Medical):   Marland Kitchen Lack of Transportation (Non-Medical):   Physical Activity:   . Days of  Exercise per Week:   . Minutes of Exercise per Session:   Stress:   . Feeling of Stress :   Social Connections:   . Frequency of Communication with Friends and Family:   . Frequency of Social Gatherings with Friends and Family:   . Attends Religious Services:   . Active Member of Clubs or Organizations:   . Attends Archivist Meetings:   Marland Kitchen Marital Status:   Intimate Partner Violence:   . Fear of Current or Ex-Partner:   . Emotionally Abused:   Marland Kitchen Physically Abused:   . Sexually Abused:      FAMILY HISTORY:  Family History  Problem Relation Age of Onset  . Heart disease  Mother   . Hypertension Mother   . Diabetes Father   . Macular degeneration Father   . Hypertension Father     Otherwise negative.   REVIEW OF SYSTEMS:  Review of Systems  Constitutional: Negative for chills and fever.  HENT: Negative for congestion and sore throat.   Respiratory: Negative for cough and shortness of breath.   Gastrointestinal: Positive for abdominal pain and nausea. Negative for blood in stool, constipation, diarrhea and vomiting.  Genitourinary: Negative for dysuria and urgency.  All other systems reviewed and are negative.   VITAL SIGNS:  Temp:  [98.1 F (36.7 C)] 98.1 F (36.7 C) (03/29 1148) Pulse Rate:  [92] 92 (03/29 1148) Resp:  [16] 16 (03/29 1148) BP: (104)/(62) 104/62 (03/29 1148) SpO2:  [94 %] 94 % (03/29 1148) Weight:  [64.4 kg] 64.4 kg (03/29 1148)     Height: 5\' 6"  (167.6 cm) Weight: 64.4 kg BMI (Calculated): 22.93   PHYSICAL EXAM:  Physical Exam Vitals and nursing note reviewed.  Constitutional:      General: She is not in acute distress.    Appearance: She is well-developed and normal weight. She is not ill-appearing.  HENT:     Head: Normocephalic and atraumatic.     Comments: Covered with mask Eyes:     General: No scleral icterus.    Extraocular Movements: Extraocular movements intact.  Cardiovascular:     Rate and Rhythm: Normal rate and regular  rhythm.     Heart sounds: Normal heart sounds.  Pulmonary:     Effort: Pulmonary effort is normal. No respiratory distress.     Breath sounds: Normal breath sounds.  Abdominal:     General: Abdomen is flat. There is no distension.     Palpations: Abdomen is soft.     Tenderness: There is abdominal tenderness in the right upper quadrant and epigastric area. There is no guarding or rebound. Negative signs include Murphy's sign.     Comments: Her abdomen is soft, tenderness primarily in the RUQ, no rebound/guarding, no distension, negative Murphy's Sign  Genitourinary:    Comments: Deferred Skin:    General: Skin is warm and dry.     Coloration: Skin is not jaundiced or pale.  Neurological:     General: No focal deficit present.     Mental Status: She is alert and oriented to person, place, and time.  Psychiatric:        Mood and Affect: Mood normal.        Behavior: Behavior normal.     INTAKE/OUTPUT:  This shift: No intake/output data recorded.  Last 2 shifts: @IOLAST2SHIFTS @  Labs:  CBC Latest Ref Rng & Units 05/12/2019 05/09/2019 05/08/2019  WBC 4.0 - 10.5 K/uL 11.3(H) 9.4 13.8(H)  Hemoglobin 12.0 - 15.0 g/dL 14.1 11.7(L) 11.9(L)  Hematocrit 36.0 - 46.0 % 44.1 35.6(L) 35.8(L)  Platelets 150 - 400 K/uL 371 237 205   CMP Latest Ref Rng & Units 05/12/2019 05/09/2019 05/08/2019  Glucose 70 - 99 mg/dL 166(H) 101(H) 106(H)  BUN 8 - 23 mg/dL 6(L) <5(L) 6(L)  Creatinine 0.44 - 1.00 mg/dL 0.59 0.71 0.67  Sodium 135 - 145 mmol/L 136 138 138  Potassium 3.5 - 5.1 mmol/L 3.4(L) 3.4(L) 3.5  Chloride 98 - 111 mmol/L 100 103 104  CO2 22 - 32 mmol/L 28 27 27   Calcium 8.9 - 10.3 mg/dL 8.5(L) 8.1(L) 7.9(L)  Total Protein 6.5 - 8.1 g/dL 7.2 5.6(L) 5.6(L)  Total Bilirubin 0.3 - 1.2 mg/dL 0.4 1.2  1.0  Alkaline Phos 38 - 126 U/L 287(H) 112 97  AST 15 - 41 U/L 35 20 23  ALT 0 - 44 U/L 78(H) 20 21     Imaging studies:   RUQ [place] (05/12/2019) personally reviewed showing cholelithiasis and sludge  with thickened GB wall to 72mm without CBD dilation, and radiologist report reviewed below:  IMPRESSION: 1. Cholelithiasis, gallbladder sludge and prominent diffuse gallbladder wall thickening. No sonographic Murphy's sign or pericholecystic fluid. Sonographic findings are equivocal for acute versus chronic cholecystitis. Hepatobiliary scintigraphy study may be obtained for further evaluation as clinically warranted. 2. Normal caliber common bile duct (5 mm diameter). 3. Normal liver.  CT Abdomen/Pelvis (05/07/2019) personally reviewed which again shows cholelithiasis, GB wall thickening, and it appears transverse colon is in close proximity concerning for possible inflammatory response, and radiologist report reviewed:  IMPRESSION: 1. Gallbladder wall thickening and noncalcified stones versus sludge, consistent with acute cholecystitis.   Assessment/Plan: (ICD-10's: K81.9) 69 y.o. female with leukocytosis and recurrent RUQ abdominal pain concerning for cholecystitis without evidence of biliary obstruction   - Patient known to Dr Lysle Pearl following recent admission for same on 03/24 to his service. We have discussed this patient with him and he is currently in the OR. We will admit this patient to our service, and he has agreed to assume care once available.     - Admit to general surgery  - NPO for now  - IVF Resuscitation w/ KCL for K+ repletion  - IV ABx (Zosyn)   - Monitor abdominal examination  - Morning labs (CBC, CMP)  - Will defer any plans for intervention to Dr Lysle Pearl   - Medical management of comorbidities; home medications  - DVT prophylaxis; hold for potential interventions  All of the above findings and recommendations were discussed with the patient and her family at bedside, and all of their questions were answered to their expressed satisfaction.  -- Edison Simon, PA-C [facility] 05/12/2019, 2:09 PM [phone] M-F: 7am - 4pm  I saw and  evaluated the patient.  I agree with the above documentation, exam, and plan, which I have edited where appropriate. Fredirick Maudlin  2:38 PM

## 2019-05-13 ENCOUNTER — Inpatient Hospital Stay: Payer: Medicare Other

## 2019-05-13 LAB — COMPREHENSIVE METABOLIC PANEL
ALT: 52 U/L — ABNORMAL HIGH (ref 0–44)
AST: 23 U/L (ref 15–41)
Albumin: 2.8 g/dL — ABNORMAL LOW (ref 3.5–5.0)
Alkaline Phosphatase: 213 U/L — ABNORMAL HIGH (ref 38–126)
Anion gap: 9 (ref 5–15)
BUN: <5 mg/dL — ABNORMAL LOW (ref 8–23)
CO2: 25 mmol/L (ref 22–32)
Calcium: 8.2 mg/dL — ABNORMAL LOW (ref 8.9–10.3)
Chloride: 104 mmol/L (ref 98–111)
Creatinine, Ser: 0.7 mg/dL (ref 0.44–1.00)
GFR calc Af Amer: >60 mL/min (ref >60)
GFR calc non Af Amer: >60 mL/min (ref >60)
Glucose, Bld: 129 mg/dL — ABNORMAL HIGH (ref 70–99)
Potassium: 3.9 mmol/L (ref 3.5–5.1)
Sodium: 138 mmol/L (ref 135–145)
Total Bilirubin: 0.6 mg/dL (ref 0.3–1.2)
Total Protein: 6 g/dL — ABNORMAL LOW (ref 6.5–8.1)

## 2019-05-13 LAB — CBC
HCT: 37.9 % (ref 36.0–46.0)
Hemoglobin: 12.6 g/dL (ref 12.0–15.0)
MCH: 33 pg (ref 26.0–34.0)
MCHC: 33.2 g/dL (ref 30.0–36.0)
MCV: 99.2 fL (ref 80.0–100.0)
Platelets: 332 10*3/uL (ref 150–400)
RBC: 3.82 MIL/uL — ABNORMAL LOW (ref 3.87–5.11)
RDW: 12.3 % (ref 11.5–15.5)
WBC: 16.6 10*3/uL — ABNORMAL HIGH (ref 4.0–10.5)
nRBC: 0 % (ref 0.0–0.2)

## 2019-05-13 MED ORDER — MIDAZOLAM HCL 2 MG/2ML IJ SOLN
INTRAMUSCULAR | Status: DC | PRN
  Administered 2019-05-13 (×2): 1 mg via INTRAVENOUS

## 2019-05-13 MED ORDER — MIDAZOLAM HCL 5 MG/5ML IJ SOLN
INTRAMUSCULAR | Status: AC
  Filled 2019-05-13: qty 5

## 2019-05-13 MED ORDER — FENTANYL CITRATE (PF) 100 MCG/2ML IJ SOLN
INTRAMUSCULAR | Status: DC | PRN
  Administered 2019-05-13: 50 ug via INTRAVENOUS

## 2019-05-13 MED ORDER — FENTANYL CITRATE (PF) 100 MCG/2ML IJ SOLN
INTRAMUSCULAR | Status: AC
  Filled 2019-05-13: qty 2

## 2019-05-13 NOTE — Progress Notes (Signed)
Pt stable after Pct cholecystostomy.Vss.Abd stable.F/u with her MD. We can remove tude in IR when needed.

## 2019-05-13 NOTE — H&P (Signed)
Chief Complaint: Patient was seen in consultation today for  Chief Complaint  Patient presents with  . Abdominal Pain      Supervising Physician: Register  Patient Status: [facility] - In-pt  History of Present Illness: Terri James is a 69 y.o. female in for pct cholecystostomy.  Past Medical History:  Diagnosis Date  . Arthritis   . Cancer [facility]) 2003   left breast  . Chronic low back pain    continuous use of opioids  . COPD (chronic obstructive pulmonary disease) ([place])   . Depression   . Diabetes mellitus without complication (HCC)    diet controlled  . GERD (gastroesophageal reflux disease)   . History of hiatal hernia   . HOH (hard of hearing)   . Hyperlipidemia   . Left knee pain   . Osteoporosis   . Personal history of chemotherapy 2003   Left breast ca  . Personal history of radiation therapy 2003   left breast ca  . Pneumonia     Past Surgical History:  Procedure Laterality Date  . ABDOMINAL HYSTERECTOMY    . BACK SURGERY  2002   fusion with screws, second surgery/  . BREAST EXCISIONAL BIOPSY Left 2003   positive  . BREAST LUMPECTOMY Left 2003   f/u with chemo and radiation   . CATARACT EXTRACTION W/PHACO Right 08/24/2015   Procedure: CATARACT EXTRACTION PHACO AND INTRAOCULAR LENS PLACEMENT (IOC);  Surgeon: Birder Robson, MD;  Location: [facility] ORS;  Service: Ophthalmology;  Laterality: Right;  [place] 1.02 AP% 18.2 CDE 11.28 Fluid Pack lot # [id number]  . CATARACT EXTRACTION W/PHACO Left 05/09/2016   Procedure: CATARACT EXTRACTION PHACO AND INTRAOCULAR LENS PLACEMENT (IOC);  Surgeon: Birder Robson, MD;  Location: [facility] ORS;  Service: Ophthalmology;  Laterality: Left;  [place] 00:49 AP% 17.2 CDE 8.45 Fluid pack lot # [id number]  . COLONOSCOPY WITH PROPOFOL N/A 08/06/2015   Procedure: COLONOSCOPY WITH PROPOFOL;  Surgeon: Manya Silvas, MD;  Location: [facility] ENDOSCOPY;  Service: Endoscopy;  Laterality: N/A;  . DILATION AND CURETTAGE OF UTERUS    .  ESOPHAGOGASTRODUODENOSCOPY (EGD) WITH PROPOFOL N/A 08/06/2015   Procedure: ESOPHAGOGASTRODUODENOSCOPY (EGD) WITH PROPOFOL;  Surgeon: Manya Silvas, MD;  Location: [facility] ENDOSCOPY;  Service: Endoscopy;  Laterality: N/A;  . ganglion cyst removal Left   . POSTERIOR FUSION LUMBAR SPINE     x2  . TONSILLECTOMY      Allergies: Patient has no known allergies.  Medications: Prior to Admission medications   Medication Sig Start Date End Date Taking? Authorizing Provider  acetaminophen (TYLENOL) 325 MG tablet Take 650 mg by mouth 2 (two) times daily as needed for moderate pain or headache.    Yes [provider]  amoxicillin-clavulanate (AUGMENTIN) 875-125 MG tablet Take 1 tablet by mouth 2 (two) times daily for 7 days. 05/09/19 05/16/19 Yes Sakai, Isami, DO  Calcium Carbonate-Vitamin D 600-125 MG-UNIT TABS Take 1 tablet by mouth daily.   Yes [provider]  clonazePAM (KLONOPIN) 0.5 MG tablet Take 0.25 mg by mouth daily as needed for anxiety.    Yes [provider]  cyclobenzaprine (FLEXERIL) 10 MG tablet Take 10 mg by mouth at bedtime.    Yes [provider]  gabapentin (NEURONTIN) 600 MG tablet Take 600 mg by mouth 4 (four) times daily.   Yes [provider]  morphine (MS CONTIN) 15 MG 12 hr tablet Take 15 mg by mouth every 8 (eight) hours.   Yes [provider]  Multiple Vitamin (MULTIVITAMIN  WITH MINERALS) TABS tablet Take 1 tablet by mouth daily.   Yes [provider]  pantoprazole (PROTONIX) 40 MG tablet Take 40 mg by mouth daily.   Yes [provider]  simvastatin (ZOCOR) 40 MG tablet Take 40 mg by mouth at bedtime.    Yes [provider]     Family History  Problem Relation Age of Onset  . Heart disease Mother   . Hypertension Mother   . Diabetes Father   . Macular degeneration Father   . Hypertension Father     Social History   Socioeconomic History  . Marital status: Widowed    Spouse name: Not on  file  . Number of children: Not on file  . Years of education: Not on file  . Highest education level: Not on file  Occupational History  . Not on file  Tobacco Use  . Smoking status: Current Every Day Smoker    Packs/day: 0.50    Years: 46.00    Pack years: 23.00    Types: Cigarettes  . Smokeless tobacco: Never Used  Substance and Sexual Activity  . Alcohol use: Yes  . Drug use: No  . Sexual activity: Never  Other Topics Concern  . Not on file  Social History Narrative  . Not on file   Social Determinants of Health   Financial Resource Strain:   . Difficulty of Paying Living Expenses:   Food Insecurity:   . Worried About Charity fundraiser in the Last Year:   . Arboriculturist in the Last Year:   Transportation Needs:   . Film/video editor (Medical):   Marland Kitchen Lack of Transportation (Non-Medical):   Physical Activity:   . Days of Exercise per Week:   . Minutes of Exercise per Session:   Stress:   . Feeling of Stress :   Social Connections:   . Frequency of Communication with Friends and Family:   . Frequency of Social Gatherings with Friends and Family:   . Attends Religious Services:   . Active Member of Clubs or Organizations:   . Attends Archivist Meetings:   Marland Kitchen Marital Status:       Review of Systems: A 12 point ROS discussed and pertinent positives are indicated in the HPI above.  All other systems are negative.  Review of Systems  Vital Signs: BP 106/63 (BP Location: Right Arm)   Pulse (!) 105   Temp 98.8 F (37.1 C) (Oral)   Resp 16   Ht 5\' 6"  (1.676 m)   Wt 64.4 kg   SpO2 (!) 89%   BMI 22.92 kg/m   Physical Exam HENT clear.Chest clear.Abd benign.  Imaging: DG Chest 2 View  Result Date: 05/07/2019 CLINICAL DATA:  Fever. EXAM: CHEST - 2 VIEW COMPARISON:  October 08, 2005 FINDINGS: The heart, hila, mediastinum, and pleura are normal. Mild scarring or atelectasis in the right base. No suspicious infiltrates. Hyperinflation of the  lungs. IMPRESSION: COPD or emphysema.  Atelectasis or scarring in the right base. Electronically Signed   By: Dorise Bullion III M.D   On: 05/07/2019 19:24   CT ABDOMEN PELVIS W CONTRAST  Result Date: 05/07/2019 CLINICAL DATA:  Fever, abnormal labs, history of left breast cancer status post mastectomy EXAM: CT ABDOMEN AND PELVIS WITH CONTRAST TECHNIQUE: Multidetector CT imaging of the abdomen and pelvis was performed using the standard protocol following bolus administration of intravenous contrast. CONTRAST:  162mL OMNIPAQUE IOHEXOL 300 MG/ML  SOLN COMPARISON:  None. FINDINGS: Lower chest: Lung bases are clear. Hepatobiliary: There is diffuse gallbladder wall thickening, with noncalcified gallstones versus biliary sludge. Findings are diagnostic of acute cholecystitis. There is geographic fatty infiltration of the liver. No biliary dilation. Pancreas: Unremarkable. No pancreatic ductal dilatation or surrounding inflammatory changes. Spleen: Normal in size without focal abnormality. Adrenals/Urinary Tract: Adrenal glands are unremarkable. Kidneys are normal, without renal calculi, focal lesion, or hydronephrosis. Bladder is unremarkable. Stomach/Bowel: No bowel obstruction or ileus. Normal retrocecal appendix. No bowel wall thickening or inflammatory change. Vascular/Lymphatic: Aortic atherosclerosis. No enlarged abdominal or pelvic lymph nodes. Reproductive: Status post hysterectomy. No adnexal masses. Other: No abdominal wall hernia or abnormality. No abdominopelvic ascites. Musculoskeletal: No acute or destructive bony lesions. Postsurgical changes are seen from lower lumbar fusion. Reconstructed images demonstrate no additional findings. IMPRESSION: 1. Gallbladder wall thickening and noncalcified stones versus sludge, consistent with acute cholecystitis. Electronically Signed   By: Randa Ngo M.D.   On: 05/07/2019 20:24   US ABDOMEN LIMITED RUQ  Result Date: 05/12/2019 CLINICAL DATA:  Right upper  quadrant abdominal pain for 2 days. EXAM: ULTRASOUND ABDOMEN LIMITED RIGHT UPPER QUADRANT COMPARISON:  05/07/2019 CT abdomen/pelvis. FINDINGS: Gallbladder: Numerous shadowing subcentimeter calcified gallstones in the gallbladder with moderate sludge. Marked diffuse gallbladder wall thickening (10 mm). No sonographic Murphy sign. No pericholecystic fluid. Common bile duct: Diameter: 5 mm Liver: Top-normal liver parenchymal echogenicity. Normal liver parenchymal echotexture. No liver masses. Portal vein is patent on color Doppler imaging with normal direction of blood flow towards the liver. Other: None. IMPRESSION: 1. Cholelithiasis, gallbladder sludge and prominent diffuse gallbladder wall thickening. No sonographic Murphy's sign or pericholecystic fluid. Sonographic findings are equivocal for acute versus chronic cholecystitis. Hepatobiliary scintigraphy study may be obtained for further evaluation as clinically warranted. 2. Normal caliber common bile duct (5 mm diameter). 3. Normal liver. Electronically Signed   By: Ilona Sorrel M.D.   On: 05/12/2019 14:03    Labs:  CBC: Recent Labs    05/08/19 0505 05/09/19 0522 05/12/19 1152 05/13/19 0505  WBC 13.8* 9.4 11.3* 16.6*  HGB 11.9* 11.7* 14.1 12.6  HCT 35.8* 35.6* 44.1 37.9  PLT 205 237 371 332    COAGS: No results for input(s): INR, APTT in the last 8760 hours.  BMP: Recent Labs    05/08/19 0505 05/09/19 0522 05/12/19 1152 05/13/19 0505  NA 138 138 136 138  K 3.5 3.4* 3.4* 3.9  CL 104 103 100 104  CO2 27 27 28 25   GLUCOSE 106* 101* 166* 129*  BUN 6* <5* 6* <5*  CALCIUM 7.9* 8.1* 8.5* 8.2*  CREATININE 0.67 0.71 0.59 0.70  GFRNONAA >60 >60 >60 >60  GFRAA >60 >60 >60 >60    LIVER FUNCTION TESTS: Recent Labs    05/08/19 0505 05/09/19 0522 05/12/19 1152 05/13/19 0505  BILITOT 1.0 1.2 0.4 0.6  AST 23 20 35 23  ALT 21 20 78* 52*  ALKPHOS 97 112 287* 213*  PROT 5.6* 5.6* 7.2 6.0*  ALBUMIN 2.7* 2.7* 3.4* 2.8*    TUMOR  MARKERS: No results for input(s): AFPTM, CEA, CA199, CHROMGRNA in the last 8760 hours.  Assessment and Plan: Pct cholecystostomy.  Thank you for this interesting consult.  I greatly enjoyed meeting Terri James and look forward to participating in their care.  A copy of this report was sent to the requesting provider on this date.  Electronically Signed: Register, Melanie Crazier, MD 05/13/2019, 11:54 AM    I spent a total of 15 minin  face to face in clinical consultation, greater than 50% of which was counseling/coordinating care for this pt.

## 2019-05-13 NOTE — Progress Notes (Signed)
Subjective:  CC: Terri James is a 69 y.o. female  Hospital stay day 1,   acute cholecystitis  HPI: No acute issues overnight.  Minimal pain reported  ROS:  General: Denies weight loss, weight gain, fatigue, fevers, chills, and night sweats. Heart: Denies chest pain, palpitations, racing heart, irregular heartbeat, leg pain or swelling, and decreased activity tolerance. Respiratory: Denies breathing difficulty, shortness of breath, wheezing, cough, and sputum. GI: Denies change in appetite, heartburn, nausea, vomiting, constipation, diarrhea, and blood in stool. GU: Denies difficulty urinating, pain with urinating, urgency, frequency, blood in urine.   Objective:   Temp:  [98.4 F (36.9 C)-98.8 F (37.1 C)] 98.8 F (37.1 C) (03/30 1330) Pulse Rate:  [87-105] 105 (03/30 1330) Resp:  [15-22] 18 (03/30 1330) BP: (90-131)/(49-97) 131/97 (03/30 1330) SpO2:  [89 %-100 %] 98 % (03/30 1330)     Height: 5\' 6"  (167.6 cm) Weight: 64.4 kg BMI (Calculated): 22.93   Intake/Output this shift:   Intake/Output Summary (Last 24 hours) at 05/13/2019 1341 Last data filed at 05/13/2019 [id number] Gross per 24 hour  Intake 1392.46 ml  Output 500 ml  Net 892.46 ml    Constitutional :  alert, cooperative, appears stated age and no distress  Respiratory:  clear to auscultation bilaterally  Cardiovascular:  regular rate and rhythm  Gastrointestinal: soft, no TTP, no distention.  palpable gallbladder through adominal wall in RUQ.   Skin: Cool and moist.   Psychiatric: Normal affect, non-agitated, not confused       LABS:  CMP Latest Ref Rng & Units 05/13/2019 05/12/2019 05/09/2019  Glucose 70 - 99 mg/dL 129(H) 166(H) 101(H)  BUN 8 - 23 mg/dL <5(L) 6(L) <5(L)  Creatinine 0.44 - 1.00 mg/dL 0.70 0.59 0.71  Sodium 135 - 145 mmol/L 138 136 138  Potassium 3.5 - 5.1 mmol/L 3.9 3.4(L) 3.4(L)  Chloride 98 - 111 mmol/L 104 100 103  CO2 22 - 32 mmol/L 25 28 27   Calcium 8.9 - 10.3 mg/dL 8.2(L) 8.5(L) 8.1(L)   Total Protein 6.5 - 8.1 g/dL 6.0(L) 7.2 5.6(L)  Total Bilirubin 0.3 - 1.2 mg/dL 0.6 0.4 1.2  Alkaline Phos 38 - 126 U/L 213(H) 287(H) 112  AST 15 - 41 U/L 23 35 20  ALT 0 - 44 U/L 52(H) 78(H) 20   CBC Latest Ref Rng & Units 05/13/2019 05/12/2019 05/09/2019  WBC 4.0 - 10.5 K/uL 16.6(H) 11.3(H) 9.4  Hemoglobin 12.0 - 15.0 g/dL 12.6 14.1 11.7(L)  Hematocrit 36.0 - 46.0 % 37.9 44.1 35.6(L)  Platelets 150 - 400 K/uL 332 371 237    RADS: CLINICAL DATA:  Right upper quadrant abdominal pain for 2 days.  EXAM: ULTRASOUND ABDOMEN LIMITED RIGHT UPPER QUADRANT  COMPARISON:  05/07/2019 CT abdomen/pelvis.  FINDINGS: Gallbladder:  Numerous shadowing subcentimeter calcified gallstones in the gallbladder with moderate sludge. Marked diffuse gallbladder wall thickening (10 mm). No sonographic Murphy sign. No pericholecystic fluid.  Common bile duct:  Diameter: 5 mm  Liver:  Top-normal liver parenchymal echogenicity. Normal liver parenchymal echotexture. No liver masses. Portal vein is patent on color Doppler imaging with normal direction of blood flow towards the liver.  Other: None.  IMPRESSION: 1. Cholelithiasis, gallbladder sludge and prominent diffuse gallbladder wall thickening. No sonographic Murphy's sign or pericholecystic fluid. Sonographic findings are equivocal for acute versus chronic cholecystitis. Hepatobiliary scintigraphy study may be obtained for further evaluation as clinically warranted. 2. Normal caliber common bile duct (5 mm diameter). 3. Normal liver.   Electronically Signed   By: Corene Cornea  A Poff M.D.   On: 05/12/2019 14:03 Assessment:   Chronic cholecystitis, failed outpt medical management while awaiting interval chole secondary to recent [facility] powder use.  Will ask IR for chole tube to relieve acute infection.  Hopefully can bridge to interval lap chole after tube placement.  All questions addressed

## 2019-05-13 NOTE — Progress Notes (Signed)
Patient clinically stable post Chole tube placement per Dr Register, tolerated well without complaints at this time. Vitals stable throughout procedure. Denies complaints post procedure. Received versed 2mg  along with Fentanyl 21mcg iv for procedure. Report given to care nurse Konrad Penta with questions answered.

## 2019-05-14 LAB — CBC WITH DIFFERENTIAL/PLATELET
Abs Immature Granulocytes: 0.06 10*3/uL (ref 0.00–0.07)
Basophils Absolute: 0.1 10*3/uL (ref 0.0–0.1)
Basophils Relative: 0 %
Eosinophils Absolute: 0.2 10*3/uL (ref 0.0–0.5)
Eosinophils Relative: 1 %
HCT: 38.7 % (ref 36.0–46.0)
Hemoglobin: 12.4 g/dL (ref 12.0–15.0)
Immature Granulocytes: 0 %
Lymphocytes Relative: 9 %
Lymphs Abs: 1.2 10*3/uL (ref 0.7–4.0)
MCH: 32.5 pg (ref 26.0–34.0)
MCHC: 32 g/dL (ref 30.0–36.0)
MCV: 101.3 fL — ABNORMAL HIGH (ref 80.0–100.0)
Monocytes Absolute: 1.1 10*3/uL — ABNORMAL HIGH (ref 0.1–1.0)
Monocytes Relative: 8 %
Neutro Abs: 11.2 10*3/uL — ABNORMAL HIGH (ref 1.7–7.7)
Neutrophils Relative %: 82 %
Platelets: 328 10*3/uL (ref 150–400)
RBC: 3.82 MIL/uL — ABNORMAL LOW (ref 3.87–5.11)
RDW: 12.5 % (ref 11.5–15.5)
WBC: 13.8 10*3/uL — ABNORMAL HIGH (ref 4.0–10.5)
nRBC: 0 % (ref 0.0–0.2)

## 2019-05-14 LAB — MAGNESIUM: Magnesium: 2.1 mg/dL (ref 1.7–2.4)

## 2019-05-14 LAB — HEPATIC FUNCTION PANEL
ALT: 41 U/L (ref 0–44)
AST: 36 U/L (ref 15–41)
Albumin: 2.6 g/dL — ABNORMAL LOW (ref 3.5–5.0)
Alkaline Phosphatase: 190 U/L — ABNORMAL HIGH (ref 38–126)
Bilirubin, Direct: 0.2 mg/dL (ref 0.0–0.2)
Indirect Bilirubin: 0.5 mg/dL (ref 0.3–0.9)
Total Bilirubin: 0.7 mg/dL (ref 0.3–1.2)
Total Protein: 5.9 g/dL — ABNORMAL LOW (ref 6.5–8.1)

## 2019-05-14 LAB — BASIC METABOLIC PANEL
Anion gap: 8 (ref 5–15)
BUN: 6 mg/dL — ABNORMAL LOW (ref 8–23)
CO2: 25 mmol/L (ref 22–32)
Calcium: 8 mg/dL — ABNORMAL LOW (ref 8.9–10.3)
Chloride: 107 mmol/L (ref 98–111)
Creatinine, Ser: 0.62 mg/dL (ref 0.44–1.00)
GFR calc Af Amer: >60 mL/min (ref >60)
GFR calc non Af Amer: >60 mL/min (ref >60)
Glucose, Bld: 106 mg/dL — ABNORMAL HIGH (ref 70–99)
Potassium: 4.1 mmol/L (ref 3.5–5.1)
Sodium: 140 mmol/L (ref 135–145)

## 2019-05-14 LAB — PHOSPHORUS: Phosphorus: 2.2 mg/dL — ABNORMAL LOW (ref 2.5–4.6)

## 2019-05-14 MED ORDER — K PHOS MONO-SOD PHOS DI & MONO 155-852-130 MG PO TABS
500.0000 mg | ORAL_TABLET | Freq: Two times a day (BID) | ORAL | Status: AC
  Administered 2019-05-14 (×2): 500 mg via ORAL
  Filled 2019-05-14 (×2): qty 2

## 2019-05-14 MED ORDER — MORPHINE SULFATE ER 15 MG PO TBCR
15.0000 mg | EXTENDED_RELEASE_TABLET | Freq: Three times a day (TID) | ORAL | Status: DC
  Administered 2019-05-14 – 2019-05-15 (×3): 15 mg via ORAL
  Filled 2019-05-14 (×3): qty 1

## 2019-05-14 NOTE — Progress Notes (Addendum)
Subjective:  CC: Terri James is a 69 y.o. female  Hospital stay day 2,   acute cholecystitis, s/p cholecystostomy tube placement  HPI: No acute issues overnight.  Pain around drain site.    ROS:  General: Denies weight loss, weight gain, fatigue, fevers, chills, and night sweats. Heart: Denies chest pain, palpitations, racing heart, irregular heartbeat, leg pain or swelling, and decreased activity tolerance. Respiratory: Denies breathing difficulty, shortness of breath, wheezing, cough, and sputum. GI: Denies change in appetite, heartburn, nausea, vomiting, constipation, diarrhea, and blood in stool. GU: Denies difficulty urinating, pain with urinating, urgency, frequency, blood in urine.   Objective:   Temp:  [98 F (36.7 C)-98.3 F (36.8 C)] 98.3 F (36.8 C) (03/31 1405) Pulse Rate:  [94-99] 94 (03/31 1405) Resp:  [12-16] 12 (03/31 0443) BP: (97-101)/(47-59) 99/52 (03/31 1405) SpO2:  [93 %-98 %] 96 % (03/31 1405)     Height: 5\' 6"  (167.6 cm) Weight: 64.4 kg BMI (Calculated): 22.93   Intake/Output this shift:   Intake/Output Summary (Last 24 hours) at 05/14/2019 1431 Last data filed at 05/14/2019 1423 Gross per 24 hour  Intake 2501.85 ml  Output 1780 ml  Net 721.85 ml    Constitutional :  alert, cooperative, appears stated age and no distress  Respiratory:  clear to auscultation bilaterally  Cardiovascular:  regular rate and rhythm  Gastrointestinal: Soft, no guarding, TTP around drain site.  Drain with thick purulent fluid and some blood.   Skin: Cool and moist.   Psychiatric: Normal affect, non-agitated, not confused       LABS:  CMP Latest Ref Rng & Units 05/14/2019 05/13/2019 05/12/2019  Glucose 70 - 99 mg/dL 106(H) 129(H) 166(H)  BUN 8 - 23 mg/dL 6(L) <5(L) 6(L)  Creatinine 0.44 - 1.00 mg/dL 0.62 0.70 0.59  Sodium 135 - 145 mmol/L 140 138 136  Potassium 3.5 - 5.1 mmol/L 4.1 3.9 3.4(L)  Chloride 98 - 111 mmol/L 107 104 100  CO2 22 - 32 mmol/L 25 25 28    Calcium 8.9 - 10.3 mg/dL 8.0(L) 8.2(L) 8.5(L)  Total Protein 6.5 - 8.1 g/dL 5.9(L) 6.0(L) 7.2  Total Bilirubin 0.3 - 1.2 mg/dL 0.7 0.6 0.4  Alkaline Phos 38 - 126 U/L 190(H) 213(H) 287(H)  AST 15 - 41 U/L 36 23 35  ALT 0 - 44 U/L 41 52(H) 78(H)   CBC Latest Ref Rng & Units 05/14/2019 05/13/2019 05/12/2019  WBC 4.0 - 10.5 K/uL 13.8(H) 16.6(H) 11.3(H)  Hemoglobin 12.0 - 15.0 g/dL 12.4 12.6 14.1  Hematocrit 36.0 - 46.0 % 38.7 37.9 44.1  Platelets 150 - 400 K/uL 328 332 371    RADS: n/a Assessment:   Chronic cholecystitis, failed outpt medical management while awaiting interval chole secondary to recent [facility] powder use.  S/p chole tube placement.  On clears. Labs downtrending.  Advance to regular diet and see how she tolerates prior to d/c.  Hopefully home tomorrow with abx completion.  Discussed interval chole likely will be in another 4-6wks.

## 2019-05-15 LAB — MAGNESIUM: Magnesium: 2.1 mg/dL (ref 1.7–2.4)

## 2019-05-15 LAB — CBC WITH DIFFERENTIAL/PLATELET
Abs Immature Granulocytes: 0.05 10*3/uL (ref 0.00–0.07)
Basophils Absolute: 0.1 10*3/uL (ref 0.0–0.1)
Basophils Relative: 1 %
Eosinophils Absolute: 0.3 10*3/uL (ref 0.0–0.5)
Eosinophils Relative: 3 %
HCT: 34.5 % — ABNORMAL LOW (ref 36.0–46.0)
Hemoglobin: 11.2 g/dL — ABNORMAL LOW (ref 12.0–15.0)
Immature Granulocytes: 0 %
Lymphocytes Relative: 14 %
Lymphs Abs: 1.5 10*3/uL (ref 0.7–4.0)
MCH: 32.3 pg (ref 26.0–34.0)
MCHC: 32.5 g/dL (ref 30.0–36.0)
MCV: 99.4 fL (ref 80.0–100.0)
Monocytes Absolute: 1.2 10*3/uL — ABNORMAL HIGH (ref 0.1–1.0)
Monocytes Relative: 10 %
Neutro Abs: 8.1 10*3/uL — ABNORMAL HIGH (ref 1.7–7.7)
Neutrophils Relative %: 72 %
Platelets: 337 10*3/uL (ref 150–400)
RBC: 3.47 MIL/uL — ABNORMAL LOW (ref 3.87–5.11)
RDW: 12.5 % (ref 11.5–15.5)
WBC: 11.2 10*3/uL — ABNORMAL HIGH (ref 4.0–10.5)
nRBC: 0 % (ref 0.0–0.2)

## 2019-05-15 LAB — BASIC METABOLIC PANEL
Anion gap: 7 (ref 5–15)
BUN: <5 mg/dL — ABNORMAL LOW (ref 8–23)
CO2: 26 mmol/L (ref 22–32)
Calcium: 7.7 mg/dL — ABNORMAL LOW (ref 8.9–10.3)
Chloride: 108 mmol/L (ref 98–111)
Creatinine, Ser: 0.64 mg/dL (ref 0.44–1.00)
GFR calc Af Amer: >60 mL/min (ref >60)
GFR calc non Af Amer: >60 mL/min (ref >60)
Glucose, Bld: 101 mg/dL — ABNORMAL HIGH (ref 70–99)
Potassium: 3.7 mmol/L (ref 3.5–5.1)
Sodium: 141 mmol/L (ref 135–145)

## 2019-05-15 LAB — HEPATIC FUNCTION PANEL
ALT: 36 U/L (ref 0–44)
AST: 38 U/L (ref 15–41)
Albumin: 2.3 g/dL — ABNORMAL LOW (ref 3.5–5.0)
Alkaline Phosphatase: 162 U/L — ABNORMAL HIGH (ref 38–126)
Bilirubin, Direct: 0.1 mg/dL (ref 0.0–0.2)
Indirect Bilirubin: 0.6 mg/dL (ref 0.3–0.9)
Total Bilirubin: 0.7 mg/dL (ref 0.3–1.2)
Total Protein: 5.3 g/dL — ABNORMAL LOW (ref 6.5–8.1)

## 2019-05-15 LAB — PHOSPHORUS: Phosphorus: 3.5 mg/dL (ref 2.5–4.6)

## 2019-05-15 MED ORDER — SODIUM CHLORIDE FLUSH 0.9 % IV SOLN: 5.0000 mL | Freq: Two times a day (BID) | INTRAVENOUS | 0 refills | Status: AC

## 2019-05-15 NOTE — Care Management Important Message (Signed)
Important Message  Patient Details  Name: Terri James MRN: [id number] Date of Birth: [birth year]   Medicare Important Message Given:  N/A - LOS <3 / Initial given by admissions     Juliann Pulse A Chayil Gantt 05/15/2019, 8:35 AM

## 2019-05-15 NOTE — Progress Notes (Signed)
Pt discharged per MD order. IV removed. Discharge instructions reviewed with pt and her son. Care instructions for the JP drain demonstrated.  Pt and son verbalized understanding. All questions answered to pt satisfaction. Pt taken to car in wheelchair by staff.

## 2019-05-15 NOTE — Discharge Instructions (Signed)
Flush tube with 28ml of normal saline twice a day.  Do not pull back fluid.  Record output daily

## 2019-05-18 LAB — AEROBIC/ANAEROBIC CULTURE W GRAM STAIN (SURGICAL/DEEP WOUND)

## 2019-05-19 NOTE — Discharge Summary (Addendum)
Physician Discharge Summary  Patient ID: Terri James MRN: [id number] DOB/AGE: [birth year] 68 y.o.  Admit date: 05/12/2019 Discharge date: 05/15/2019  Admission Diagnoses: Acute cholecystitis  Discharge Diagnoses:  Same as above  Discharged Condition: good  Hospital Course: Patient readmitted for continued exacerbation of acute cholecystitis despite outpatient antibiotic management secondary to recent [facility] powder use.  IR guided drain placement was performed and the patient's white count and symptoms improved afterwards.  At the time of discharge patient was tolerating a diet, pain control, and all labs were downtrending.  Plan is for interval cholecystectomy in 4 to 6 weeks.  Consults: IR for drain placement  Discharge Exam: Blood pressure 94/61, pulse 82, temperature (!) 97.5 F (36.4 C), temperature source Oral, resp. rate 20, height 5\' 6"  (1.676 m), weight 64.4 kg, SpO2 94 %. General appearance: alert, cooperative and no distress GI: soft, non-tender; bowel sounds normal; no masses,  no organomegaly and chole drain with thick bilious output  Disposition:  Discharge disposition: 01-Home or Self Care       Discharge Instructions    Discharge patient   Complete by: As directed    Discharge disposition: 01-Home or Self Care   Discharge patient date: 05/15/2019     Allergies as of 05/15/2019   No Known Allergies     Medication List    TAKE these medications   acetaminophen 325 MG tablet Commonly known as: TYLENOL Take 650 mg by mouth 2 (two) times daily as needed for moderate pain or headache.   Calcium Carbonate-Vitamin D 600-125 MG-UNIT Tabs Take 1 tablet by mouth daily.   clonazePAM 0.5 MG tablet Commonly known as: KLONOPIN Take 0.25 mg by mouth daily as needed for anxiety.   cyclobenzaprine 10 MG tablet Commonly known as: FLEXERIL Take 10 mg by mouth at bedtime.   gabapentin 600 MG tablet Commonly known as: NEURONTIN Take 600 mg by mouth 4 (four) times  daily.   morphine 15 MG 12 hr tablet Commonly known as: MS CONTIN Take 15 mg by mouth every 8 (eight) hours.   multivitamin with minerals Tabs tablet Take 1 tablet by mouth daily.   pantoprazole 40 MG tablet Commonly known as: PROTONIX Take 40 mg by mouth daily.   simvastatin 40 MG tablet Commonly known as: ZOCOR Take 40 mg by mouth at bedtime.   sodium chloride flush 0.9 % Soln injection Place 5 mLs into feeding tube every 12 (twelve) hours.     ASK your doctor about these medications   amoxicillin-clavulanate 875-125 MG tablet Commonly known as: Augmentin Take 1 tablet by mouth 2 (two) times daily for 7 days. Ask about: Should I take this medication?      Follow-up Information    [place], Fionna Merriott, DO. Go on 05/28/2019.   Specialty: Surgery Why: 10:15am Contact information: [address] [place] [id number] [phone]            Total time spent arranging discharge was >22min. Signed: Benjamine Sprague 05/19/2019, 11:50 AM

## 2019-05-29 ENCOUNTER — Other Ambulatory Visit
Admission: RE | Admit: 2019-05-29 | Discharge: 2019-05-29 | Disposition: A | Discharge status: Home / Self Care | Payer: Medicare Other | Source: Ambulatory Visit | Attending: Family Medicine | Admitting: Family Medicine

## 2019-05-29 DIAGNOSIS — R0602 Shortness of breath: Secondary | ICD-10-CM | POA: Diagnosis present

## 2019-05-29 DIAGNOSIS — R000 Tachycardia, unspecified: Secondary | ICD-10-CM | POA: Diagnosis present

## 2019-05-29 LAB — FIBRIN DERIVATIVES D-DIMER (ARMC ONLY): Fibrin derivatives D-dimer (ARMC): 684.62 ng/mL (FEU) — ABNORMAL HIGH (ref 0.00–499.00)

## 2019-05-30 ENCOUNTER — Other Ambulatory Visit: Payer: Self-pay

## 2019-05-30 ENCOUNTER — Other Ambulatory Visit: Payer: Self-pay | Admitting: Family Medicine

## 2019-05-30 ENCOUNTER — Ambulatory Visit
Admission: RE | Admit: 2019-05-30 | Discharge: 2019-05-30 | Disposition: A | Discharge status: Home / Self Care | Payer: Medicare Other | Source: Ambulatory Visit | Attending: Family Medicine | Admitting: Family Medicine

## 2019-05-30 DIAGNOSIS — R0602 Shortness of breath: Secondary | ICD-10-CM

## 2019-05-30 DIAGNOSIS — R7989 Other specified abnormal findings of blood chemistry: Secondary | ICD-10-CM

## 2019-05-30 DIAGNOSIS — R000 Tachycardia, unspecified: Secondary | ICD-10-CM

## 2019-05-30 MED ORDER — IOHEXOL 350 MG/ML SOLN
75.0000 mL | Freq: Once | INTRAVENOUS | Status: AC | PRN
  Administered 2019-05-30: 75 mL via INTRAVENOUS

## 2019-06-03 ENCOUNTER — Ambulatory Visit: Payer: Self-pay | Admitting: Surgery

## 2019-06-03 NOTE — H&P (View-Only) (Signed)
Subjective:   CC: Chronic cholecystitis [K81.1]  HPI:  Terri James is a 69 y.o. female who returns for above.  No issues since chole tube placement inhouse.  Past Medical History:  has a past medical history of Arthritis, Cancer (CMS-HCC) (2003), Chronic pain, COPD (chronic obstructive pulmonary disease) (CMS-HCC), Depression, GERD (gastroesophageal reflux disease), Hyperlipidemia, Low back pain, Menopause, and Osteopenia.  Past Surgical History:  has a past surgical history that includes Breast lumpectomy; Posterior fusion lumbar spine; Tonsillectomy; Hysterectomy; ganglion cyst removal (Left); Colonoscopy (03/17/2010); egd (03/17/2010); Colonoscopy (08/06/2015); egd (08/06/2015); and Eye surgery (Cataract 2017).  Family History: family history includes Colon cancer in her father; Diabetes in her father; Heart failure in her mother.  Social History:  reports that she has been smoking cigarettes. She has a 42.00 pack-year smoking history. She has never used smokeless tobacco. She reports current alcohol use. She reports that she does not use drugs.  Current Medications: has a current medication list which includes the following prescription(s): acetaminophen, buspirone, calcium 600 with vitamin d2, cyclobenzaprine, furosemide, gabapentin, morphine, [START ON 06/21/2019] morphine, naloxone, pantoprazole, proair hfa, and simvastatin.  Allergies:     Allergies as of 05/28/2019  . (No Known Allergies)    ROS:  A 15 point review of systems was performed and pertinent positives and negatives noted in HPI    Objective:   BP 109/75   Pulse (!) 113   Ht 165.1 cm (5\' 5" )   Wt 61.9 kg (136 lb 6.4 oz)   BMI 22.70 kg/m    Constitutional :  alert, appears stated age, cooperative and no distress  Lymphatics/Throat:  no asymmetry, masses, or scars  Respiratory:  clear to auscultation bilaterally  Cardiovascular:  regular rate and rhythm  Gastrointestinal: soft, non-tender;  bowel sounds normal; no masses,  no organomegaly.  Chole tube with thin bilious output, avg 30-55ml/day  Musculoskeletal: Steady gait and movement  Skin: Cool and moist  Psychiatric: Normal affect, non-agitated, not confused       LABS:  n/a   RADS: n/a Assessment:      Chronic cholecystitis [K81.1] S/p chole tube placement due to [facility] powder use Plan:   1. Chronic cholecystitis [K81.1] Discussed the risk of surgery including post-op infxn, seroma, biloma, chronic pain, poor-delayed wound healing, retained gallstone, conversion to open procedure, post-op SBO or ileus, and need for additional procedures to address said risks.  The risks of general anesthetic including MI, CVA, sudden death or even reaction to anesthetic medications also discussed. Alternatives include continued observation.  Benefits include possible symptom relief, prevention of complications including acute cholecystitis, pancreatitis.  Typical post operative recovery of 3-5 days rest, continued pain in area and incision sites, possible loose stools up to 4-6 weeks, also discussed.  ED return precautions given for sudden increase in RUQ pain, with possible accompanying fever, nausea, and/or vomiting.  The patient understands the risks, any and all questions were answered to the patient's satisfaction.  2. Patient has elected to proceed with interval lap chole Procedure will be scheduled.  Written consent was obtained..robotic assisted laparoscopic, keep drain in place until then Nutritionist request to review post chole diet placed per patient preference

## 2019-06-03 NOTE — H&P (Signed)
Subjective:   CC: Chronic cholecystitis [K81.1]  HPI:  Terri James is a 69 y.o. female who returns for above.  No issues since chole tube placement inhouse.  Past Medical History:  has a past medical history of Arthritis, Cancer (CMS-HCC) (2003), Chronic pain, COPD (chronic obstructive pulmonary disease) (CMS-HCC), Depression, GERD (gastroesophageal reflux disease), Hyperlipidemia, Low back pain, Menopause, and Osteopenia.  Past Surgical History:  has a past surgical history that includes Breast lumpectomy; Posterior fusion lumbar spine; Tonsillectomy; Hysterectomy; ganglion cyst removal (Left); Colonoscopy (03/17/2010); egd (03/17/2010); Colonoscopy (08/06/2015); egd (08/06/2015); and Eye surgery (Cataract 2017).  Family History: family history includes Colon cancer in her father; Diabetes in her father; Heart failure in her mother.  Social History:  reports that she has been smoking cigarettes. She has a 42.00 pack-year smoking history. She has never used smokeless tobacco. She reports current alcohol use. She reports that she does not use drugs.  Current Medications: has a current medication list which includes the following prescription(s): acetaminophen, buspirone, calcium 600 with vitamin d2, cyclobenzaprine, furosemide, gabapentin, morphine, [START ON 06/21/2019] morphine, naloxone, pantoprazole, proair hfa, and simvastatin.  Allergies:     Allergies as of 05/28/2019  . (No Known Allergies)    ROS:  A 15 point review of systems was performed and pertinent positives and negatives noted in HPI    Objective:   BP 109/75   Pulse (!) 113   Ht 165.1 cm (5\' 5" )   Wt 61.9 kg (136 lb 6.4 oz)   BMI 22.70 kg/m    Constitutional :  alert, appears stated age, cooperative and no distress  Lymphatics/Throat:  no asymmetry, masses, or scars  Respiratory:  clear to auscultation bilaterally  Cardiovascular:  regular rate and rhythm  Gastrointestinal: soft, non-tender;  bowel sounds normal; no masses,  no organomegaly.  Chole tube with thin bilious output, avg 30-23ml/day  Musculoskeletal: Steady gait and movement  Skin: Cool and moist  Psychiatric: Normal affect, non-agitated, not confused       LABS:  n/a   RADS: n/a Assessment:      Chronic cholecystitis [K81.1] S/p chole tube placement due to [facility] powder use Plan:   1. Chronic cholecystitis [K81.1] Discussed the risk of surgery including post-op infxn, seroma, biloma, chronic pain, poor-delayed wound healing, retained gallstone, conversion to open procedure, post-op SBO or ileus, and need for additional procedures to address said risks.  The risks of general anesthetic including MI, CVA, sudden death or even reaction to anesthetic medications also discussed. Alternatives include continued observation.  Benefits include possible symptom relief, prevention of complications including acute cholecystitis, pancreatitis.  Typical post operative recovery of 3-5 days rest, continued pain in area and incision sites, possible loose stools up to 4-6 weeks, also discussed.  ED return precautions given for sudden increase in RUQ pain, with possible accompanying fever, nausea, and/or vomiting.  The patient understands the risks, any and all questions were answered to the patient's satisfaction.  2. Patient has elected to proceed with interval lap chole Procedure will be scheduled.  Written consent was obtained..robotic assisted laparoscopic, keep drain in place until then Nutritionist request to review post chole diet placed per patient preference

## 2019-06-09 ENCOUNTER — Other Ambulatory Visit: Payer: Self-pay

## 2019-06-09 ENCOUNTER — Encounter
Admission: RE | Admit: 2019-06-09 | Discharge: 2019-06-09 | Disposition: A | Discharge status: Home / Self Care | Payer: Medicare Other | Source: Ambulatory Visit | Attending: Surgery | Admitting: Surgery

## 2019-06-09 HISTORY — DX: Emphysema, unspecified: J43.9

## 2019-06-09 NOTE — Patient Instructions (Signed)
COVID TESTING Date: Jun 18, 2019 Testing site:  [facility] [address] Thru Hours:  [id number] am - 1:00 pm Once you are tested, you are asked to stay quarantined (avoiding public places) until after your surgery.   Your procedure is scheduled on: MAY 7,2021 FRIDAY Report to Day Surgery on the 2nd floor of the [facility]. To find out your arrival time, please call [phone] between 1PM - 3PM on: Thursday Jun 19, 2019  REMEMBER: Instructions that are not followed completely may result in serious medical risk, up to and including death; or upon the discretion of your surgeon and anesthesiologist your surgery may need to be rescheduled.  Do not eat food after midnight the night before surgery.  No gum chewing, lozengers or hard candies.  You may however, drink CLEAR liquids up to 2 hours before you are scheduled to arrive for your surgery. Do not drink anything within 2 hours of your scheduled arrival time.  Clear liquids include: - water  Do NOT drink anything that is not on this list.  Type 1 and Type 2 diabetics should only drink water.    TAKE THESE MEDICATIONS THE MORNING OF SURGERY WITH A SIP OF WATER: GABAPENTIN PANTOPRAZOLE (take one the night before and one on the morning of surgery - helps to prevent nausea after surgery.)  Stop Anti-inflammatories (NSAIDS) such as Advil, Aleve, Ibuprofen, Motrin, Naproxen, Naprosyn and ASPIRN AND Aspirin based products such as Excedrin, Goodys Powder, BC Powder. (May take Tylenol or Acetaminophen if needed.)  Stop ANY OVER THE COUNTER supplements until after surgery. (May continue Vitamin D, Vitamin B, and multivitamin.)  No Alcohol for 24 hours before or after surgery.  No Smoking including e-cigarettes for 24 hours prior to surgery.  No chewable tobacco products for at least 6 hours prior to surgery.  No nicotine patches on the day of surgery.  Do not use any "recreational" drugs for at least a  week prior to your surgery.  Please be advised that the combination of cocaine and anesthesia may have negative outcomes, up to and including death. If you test positive for cocaine, your surgery will be cancelled.  On the morning of surgery brush your teeth with toothpaste and water, you may rinse your mouth with mouthwash if you wish. Do not swallow any toothpaste or mouthwash.  Do not wear jewelry, make-up, hairpins, clips or nail polish.  Do not wear lotions, powders, or perfumes OR DEODORANT   Do not shave 48 hours prior to surgery.   Contact lenses, hearing aids and dentures may not be worn into surgery.  Do not bring valuables to the hospital. [facility] is not responsible for any missing/lost belongings or valuables.   Use CHG [place] directed on instruction sheet.  Notify your doctor if there is any change in your medical condition (cold, fever, infection).  Wear comfortable clothing (specific to your surgery type) to the hospital.  Plan for stool softeners for home use; pain medications have a tendency to cause constipation. You can also help prevent constipation by eating foods high in fiber such as fruits and vegetables and drinking plenty of fluids as your diet allows.  After surgery, you can help prevent lung complications by doing breathing exercises.  Take deep breaths and cough every 1-2 hours. Your doctor may order a device called an Incentive Spirometer to help you take deep breaths. When coughing or sneezing, hold a pillow firmly against your incision with both hands. This  is called "splinting." Doing this helps protect your incision. It also decreases belly discomfort.  If you are being discharged the day of surgery, you will not be allowed to drive home. You will need a responsible adult (18 years or older) to drive you home and stay with you that night.   If you are taking public transportation, you will need to have a responsible adult (18 years or older) with  you. Please confirm with your physician that it is acceptable to use public transportation.   Please call the [facility] at [phone] if you have any questions about these instructions.  Visitation Policy:  Patients undergoing a surgery or procedure may have one family member or support person with them as long as that person is not COVID-19 positive or experiencing its symptoms.  That person may remain in the waiting area during the procedure.  Children under 47 years of age may have both parents or legal guardians with them during their hospital stay.   Inpatient Visitation Update:  Two designated support people may visit a patient during visiting hours 7 am to 8 pm. It must be the same two designated people for the duration of the patient stay. The visitors may come and go during the day, and there is no switching out to have different visitors. A mask must be worn at all times, including in the patient room.

## 2019-06-09 NOTE — Pre-Procedure Instructions (Signed)
Pre-Admit Testing Provider Notification Note  Provider Notified: Dr. Ouida Sills & Joan Flores, NP  Notification Mode: Fax  Reason: Request for Medical Clearance  Response: Fax confirmation received. Placed on chart. Noted on worksheet.  Additional Information: Dr. Lysle Pearl notified.   Signed: Beulah Gandy, RN

## 2019-06-18 ENCOUNTER — Other Ambulatory Visit: Payer: Self-pay

## 2019-06-18 ENCOUNTER — Other Ambulatory Visit
Admission: RE | Admit: 2019-06-18 | Discharge: 2019-06-18 | Disposition: A | Discharge status: Home / Self Care | Payer: Medicare Other | Source: Ambulatory Visit | Attending: Surgery | Admitting: Surgery

## 2019-06-18 DIAGNOSIS — Z01812 Encounter for preprocedural laboratory examination: Secondary | ICD-10-CM | POA: Diagnosis present

## 2019-06-18 DIAGNOSIS — Z20822 Contact with and (suspected) exposure to covid-19: Secondary | ICD-10-CM | POA: Diagnosis not present

## 2019-06-18 LAB — SARS CORONAVIRUS 2 (TAT 6-24 HRS): SARS Coronavirus 2: NEGATIVE

## 2019-06-19 MED ORDER — INDOCYANINE GREEN 25 MG IV SOLR
1.2500 mg | Freq: Once | INTRAVENOUS | Status: DC
  Filled 2019-06-19: qty 10

## 2019-06-19 MED ORDER — CEFAZOLIN SODIUM-DEXTROSE 2-4 GM/100ML-% IV SOLN
2.0000 g | INTRAVENOUS | Status: AC
  Administered 2019-06-20: 2 g via INTRAVENOUS

## 2019-06-20 ENCOUNTER — Encounter: Payer: Self-pay | Admitting: Surgery

## 2019-06-20 ENCOUNTER — Ambulatory Visit: Payer: Medicare Other | Admitting: Certified Registered Nurse Anesthetist

## 2019-06-20 ENCOUNTER — Encounter
Admission: RE | Disposition: A | Discharge status: Home / Self Care | Payer: Self-pay | Source: Ambulatory Visit | Attending: Surgery

## 2019-06-20 ENCOUNTER — Other Ambulatory Visit: Payer: Self-pay

## 2019-06-20 ENCOUNTER — Ambulatory Visit
Admission: RE | Admit: 2019-06-20 | Discharge: 2019-06-20 | Disposition: A | Discharge status: Home / Self Care | Payer: Medicare Other | Source: Ambulatory Visit | Attending: Surgery | Admitting: Surgery

## 2019-06-20 DIAGNOSIS — K8012 Calculus of gallbladder with acute and chronic cholecystitis without obstruction: Secondary | ICD-10-CM | POA: Insufficient documentation

## 2019-06-20 DIAGNOSIS — E119 Type 2 diabetes mellitus without complications: Secondary | ICD-10-CM | POA: Insufficient documentation

## 2019-06-20 DIAGNOSIS — K819 Cholecystitis, unspecified: Secondary | ICD-10-CM

## 2019-06-20 DIAGNOSIS — K658 Other peritonitis: Secondary | ICD-10-CM | POA: Insufficient documentation

## 2019-06-20 DIAGNOSIS — K219 Gastro-esophageal reflux disease without esophagitis: Secondary | ICD-10-CM | POA: Insufficient documentation

## 2019-06-20 DIAGNOSIS — E785 Hyperlipidemia, unspecified: Secondary | ICD-10-CM | POA: Insufficient documentation

## 2019-06-20 DIAGNOSIS — F17210 Nicotine dependence, cigarettes, uncomplicated: Secondary | ICD-10-CM | POA: Insufficient documentation

## 2019-06-20 DIAGNOSIS — M8580 Other specified disorders of bone density and structure, unspecified site: Secondary | ICD-10-CM | POA: Insufficient documentation

## 2019-06-20 DIAGNOSIS — M1990 Unspecified osteoarthritis, unspecified site: Secondary | ICD-10-CM | POA: Diagnosis not present

## 2019-06-20 DIAGNOSIS — J449 Chronic obstructive pulmonary disease, unspecified: Secondary | ICD-10-CM | POA: Insufficient documentation

## 2019-06-20 DIAGNOSIS — K811 Chronic cholecystitis: Secondary | ICD-10-CM | POA: Diagnosis present

## 2019-06-20 DIAGNOSIS — Z79899 Other long term (current) drug therapy: Secondary | ICD-10-CM | POA: Insufficient documentation

## 2019-06-20 LAB — GLUCOSE, CAPILLARY
Glucose-Capillary: 115 mg/dL — ABNORMAL HIGH (ref 70–99)
Glucose-Capillary: 140 mg/dL — ABNORMAL HIGH (ref 70–99)

## 2019-06-20 SURGERY — CHOLECYSTECTOMY, ROBOT-ASSISTED, LAPAROSCOPIC
Anesthesia: General | Site: Abdomen

## 2019-06-20 MED ORDER — HYDROMORPHONE HCL 1 MG/ML IJ SOLN
INTRAMUSCULAR | Status: AC
  Filled 2019-06-20: qty 1

## 2019-06-20 MED ORDER — FENTANYL CITRATE (PF) 100 MCG/2ML IJ SOLN
25.0000 ug | INTRAMUSCULAR | Status: DC | PRN
  Administered 2019-06-20 (×2): 25 ug via INTRAVENOUS

## 2019-06-20 MED ORDER — LIDOCAINE-EPINEPHRINE (PF) 1 %-1:200000 IJ SOLN
INTRAMUSCULAR | Status: AC
  Filled 2019-06-20: qty 30

## 2019-06-20 MED ORDER — BUPIVACAINE HCL 0.5 % IJ SOLN
INTRAMUSCULAR | Status: DC | PRN
  Administered 2019-06-20: 10 mL

## 2019-06-20 MED ORDER — LACTATED RINGERS IV SOLN: INTRAVENOUS | Status: DC | PRN

## 2019-06-20 MED ORDER — DOCUSATE SODIUM 100 MG PO CAPS: 100.0000 mg | ORAL_CAPSULE | Freq: Two times a day (BID) | ORAL | 0 refills | Status: AC | PRN

## 2019-06-20 MED ORDER — ACETAMINOPHEN 10 MG/ML IV SOLN
INTRAVENOUS | Status: DC | PRN
  Administered 2019-06-20: 1000 mg via INTRAVENOUS

## 2019-06-20 MED ORDER — IBUPROFEN 800 MG PO TABS: 800.0000 mg | ORAL_TABLET | Freq: Three times a day (TID) | ORAL | 0 refills | Status: AC | PRN

## 2019-06-20 MED ORDER — SEVOFLURANE IN SOLN
RESPIRATORY_TRACT | Status: AC
  Filled 2019-06-20: qty 250

## 2019-06-20 MED ORDER — HYDROCODONE-ACETAMINOPHEN 5-325 MG PO TABS: 1.0000 | ORAL_TABLET | Freq: Once | ORAL | Status: AC

## 2019-06-20 MED ORDER — CHLORHEXIDINE GLUCONATE CLOTH 2 % EX PADS: 6.0000 | MEDICATED_PAD | Freq: Once | CUTANEOUS | Status: DC

## 2019-06-20 MED ORDER — FENTANYL CITRATE (PF) 100 MCG/2ML IJ SOLN
INTRAMUSCULAR | Status: DC | PRN
  Administered 2019-06-20: 100 ug via INTRAVENOUS

## 2019-06-20 MED ORDER — PROPOFOL 10 MG/ML IV BOLUS
INTRAVENOUS | Status: AC
  Filled 2019-06-20: qty 20

## 2019-06-20 MED ORDER — SODIUM CHLORIDE 0.9 % IV SOLN: INTRAVENOUS | Status: DC

## 2019-06-20 MED ORDER — HYDROCODONE-ACETAMINOPHEN 5-325 MG PO TABS
ORAL_TABLET | ORAL | Status: AC
  Administered 2019-06-20: 1 via ORAL
  Filled 2019-06-20: qty 1

## 2019-06-20 MED ORDER — ROCURONIUM BROMIDE 100 MG/10ML IV SOLN
INTRAVENOUS | Status: DC | PRN
  Administered 2019-06-20: 50 mg via INTRAVENOUS
  Administered 2019-06-20: 10 mg via INTRAVENOUS

## 2019-06-20 MED ORDER — DEXAMETHASONE SODIUM PHOSPHATE 10 MG/ML IJ SOLN
INTRAMUSCULAR | Status: DC | PRN
  Administered 2019-06-20: 10 mg via INTRAVENOUS

## 2019-06-20 MED ORDER — FENTANYL CITRATE (PF) 100 MCG/2ML IJ SOLN
INTRAMUSCULAR | Status: AC
  Filled 2019-06-20: qty 2

## 2019-06-20 MED ORDER — LIDOCAINE-EPINEPHRINE (PF) 1 %-1:200000 IJ SOLN
INTRAMUSCULAR | Status: DC | PRN
Start: 2019-06-20 — End: 2019-06-20
  Administered 2019-06-20: 10 mL

## 2019-06-20 MED ORDER — ONDANSETRON HCL 4 MG/2ML IJ SOLN
INTRAMUSCULAR | Status: DC | PRN
  Administered 2019-06-20: 4 mg via INTRAVENOUS

## 2019-06-20 MED ORDER — PHENYLEPHRINE HCL (PRESSORS) 10 MG/ML IV SOLN
INTRAVENOUS | Status: DC | PRN
  Administered 2019-06-20: 100 ug via INTRAVENOUS
  Administered 2019-06-20: 200 ug via INTRAVENOUS

## 2019-06-20 MED ORDER — BUPIVACAINE HCL (PF) 0.5 % IJ SOLN
INTRAMUSCULAR | Status: AC
  Filled 2019-06-20: qty 30

## 2019-06-20 MED ORDER — PROPOFOL 10 MG/ML IV BOLUS
INTRAVENOUS | Status: DC | PRN
  Administered 2019-06-20: 100 mg via INTRAVENOUS

## 2019-06-20 MED ORDER — FENTANYL CITRATE (PF) 100 MCG/2ML IJ SOLN
INTRAMUSCULAR | Status: AC
  Administered 2019-06-20: 25 ug via INTRAVENOUS
  Filled 2019-06-20: qty 2

## 2019-06-20 MED ORDER — SODIUM CHLORIDE 0.9 % IV SOLN
INTRAVENOUS | Status: DC | PRN
  Administered 2019-06-20: 60 ug/min via INTRAVENOUS

## 2019-06-20 MED ORDER — SUGAMMADEX SODIUM 200 MG/2ML IV SOLN
INTRAVENOUS | Status: DC | PRN
  Administered 2019-06-20: 124.2 mg via INTRAVENOUS

## 2019-06-20 MED ORDER — ONDANSETRON HCL 4 MG/2ML IJ SOLN: 4.0000 mg | Freq: Once | INTRAMUSCULAR | Status: DC | PRN

## 2019-06-20 MED ORDER — LIDOCAINE HCL (CARDIAC) PF 100 MG/5ML IV SOSY
PREFILLED_SYRINGE | INTRAVENOUS | Status: DC | PRN
  Administered 2019-06-20: 80 mg via INTRAVENOUS

## 2019-06-20 MED ORDER — HYDROCODONE-ACETAMINOPHEN 5-325 MG PO TABS: 1.0000 | ORAL_TABLET | Freq: Four times a day (QID) | ORAL | 0 refills | Status: AC | PRN

## 2019-06-20 SURGICAL SUPPLY — 55 items
ANCHOR TIS RET SYS 235ML (MISCELLANEOUS) ×4 IMPLANT
BAG INFUSER PRESSURE 100CC (MISCELLANEOUS) IMPLANT
BLADE SURG SZ11 CARB STEEL (BLADE) ×4 IMPLANT
CANISTER SUCT 1200ML W/VALVE (MISCELLANEOUS) ×4 IMPLANT
CANNULA REDUC XI 12-8 STAPL (CANNULA) ×1
CANNULA REDUC XI 12-8MM STAPL (CANNULA) ×1
CANNULA REDUCER 12-8 DVNC XI (CANNULA) ×2 IMPLANT
CHLORAPREP W/TINT 26 (MISCELLANEOUS) ×4 IMPLANT
CLIP VESOLOCK LG 6/CT PURPLE (CLIP) ×2 IMPLANT
CLIP VESOLOCK MED LG 6/CT (CLIP) ×4 IMPLANT
COVER TIP SHEARS 8 DVNC (MISCELLANEOUS) ×2 IMPLANT
COVER TIP SHEARS 8MM DA VINCI (MISCELLANEOUS) ×2
COVER WAND RF STERILE (DRAPES) ×4 IMPLANT
DECANTER SPIKE VIAL GLASS SM (MISCELLANEOUS) ×8 IMPLANT
DEFOGGER SCOPE WARMER CLEARIFY (MISCELLANEOUS) ×4 IMPLANT
DERMABOND ADVANCED (GAUZE/BANDAGES/DRESSINGS) ×2
DERMABOND ADVANCED .7 DNX12 (GAUZE/BANDAGES/DRESSINGS) ×2 IMPLANT
DRAPE ARM DVNC X/XI (DISPOSABLE) ×8 IMPLANT
DRAPE COLUMN DVNC XI (DISPOSABLE) ×2 IMPLANT
DRAPE DA VINCI XI ARM (DISPOSABLE) ×8
DRAPE DA VINCI XI COLUMN (DISPOSABLE) ×2
ELECT CAUTERY BLADE 6.4 (BLADE) ×4 IMPLANT
ELECT REM PT RETURN 9FT ADLT (ELECTROSURGICAL) ×4
ELECTRODE REM PT RTRN 9FT ADLT (ELECTROSURGICAL) ×2 IMPLANT
GLOVE BIOGEL PI IND STRL 7.0 (GLOVE) ×4 IMPLANT
GLOVE BIOGEL PI INDICATOR 7.0 (GLOVE) ×4
GLOVE SURG SYN 6.5 ES PF (GLOVE) ×8 IMPLANT
GLOVE SURG SYN 6.5 PF PI (GLOVE) ×4 IMPLANT
GOWN STRL REUS W/ TWL LRG LVL3 (GOWN DISPOSABLE) ×6 IMPLANT
GOWN STRL REUS W/TWL LRG LVL3 (GOWN DISPOSABLE) ×6
GRASPER SUT TROCAR 14GX15 (MISCELLANEOUS) ×2 IMPLANT
IRRIGATOR SUCT 8 DISP DVNC XI (IRRIGATION / IRRIGATOR) IMPLANT
IRRIGATOR SUCTION 8MM XI DISP (IRRIGATION / IRRIGATOR)
IV NS 1000ML (IV SOLUTION)
IV NS 1000ML BAXH (IV SOLUTION) IMPLANT
LABEL OR SOLS (LABEL) ×4 IMPLANT
NDL INSUFFLATION 14GA 120MM (NEEDLE) ×2 IMPLANT
NEEDLE HYPO 22GX1.5 SAFETY (NEEDLE) ×4 IMPLANT
NEEDLE INSUFFLATION 14GA 120MM (NEEDLE) ×4 IMPLANT
NS IRRIG 500ML POUR BTL (IV SOLUTION) ×4 IMPLANT
OBTURATOR OPTICAL STANDARD 8MM (TROCAR) ×2
OBTURATOR OPTICAL STND 8 DVNC (TROCAR) ×2
OBTURATOR OPTICALSTD 8 DVNC (TROCAR) ×2 IMPLANT
PACK LAP CHOLECYSTECTOMY (MISCELLANEOUS) ×4 IMPLANT
PENCIL ELECTRO HAND CTR (MISCELLANEOUS) ×4 IMPLANT
SEAL CANN UNIV 5-8 DVNC XI (MISCELLANEOUS) ×6 IMPLANT
SEAL XI 5MM-8MM UNIVERSAL (MISCELLANEOUS) ×6
SET TUBE SMOKE EVAC HIGH FLOW (TUBING) ×4 IMPLANT
SOLUTION ELECTROLUBE (MISCELLANEOUS) ×4 IMPLANT
STAPLER CANNULA SEAL DVNC XI (STAPLE) ×2 IMPLANT
STAPLER CANNULA SEAL XI (STAPLE) ×2
SUT MNCRL 4-0 (SUTURE) ×4
SUT MNCRL 4-0 27XMFL (SUTURE) ×4
SUT VICRYL 0 AB UR-6 (SUTURE) ×4 IMPLANT
SUTURE MNCRL 4-0 27XMF (SUTURE) ×4 IMPLANT

## 2019-06-20 NOTE — Anesthesia Procedure Notes (Signed)
Procedure Name: Intubation Date/Time: 06/20/2019 8:05 AM Performed by: Willette Alma, CRNA Pre-anesthesia Checklist: Patient identified, Patient being monitored, Timeout performed, Emergency Drugs available and Suction available Patient Re-evaluated:Patient Re-evaluated prior to induction Oxygen Delivery Method: Circle system utilized Preoxygenation: Pre-oxygenation with 100% oxygen Induction Type: IV induction Ventilation: Mask ventilation without difficulty Laryngoscope Size: Mac and 3 Grade View: Grade I Tube type: Oral Tube size: 7.0 mm Number of attempts: 1 Airway Equipment and Method: Stylet Placement Confirmation: ETT inserted through vocal cords under direct vision,  positive ETCO2 and breath sounds checked- equal and bilateral Secured at: 21 cm Tube secured with: Tape Dental Injury: Teeth and Oropharynx as per pre-operative assessment

## 2019-06-20 NOTE — Anesthesia Postprocedure Evaluation (Signed)
Anesthesia Post Note  Patient: Terri James  Procedure(s) Performed: XI ROBOTIC ASSISTED LAPAROSCOPIC CHOLECYSTECTOMY (N/A Abdomen) INDOCYANINE GREEN FLUORESCENCE IMAGING (ICG)  Patient location during evaluation: PACU Anesthesia Type: General Level of consciousness: awake and alert Pain management: pain level controlled Vital Signs Assessment: post-procedure vital signs reviewed and stable Respiratory status: spontaneous breathing, nonlabored ventilation, respiratory function stable and patient connected to nasal cannula oxygen Cardiovascular status: blood pressure returned to baseline and stable Postop Assessment: no apparent nausea or vomiting Anesthetic complications: no     Last Vitals:  Vitals:   06/20/19 1131 06/20/19 1149  BP: (!) 101/59 108/75  Pulse:  87  Resp: 16 16  Temp: (!) 36.4 C   SpO2: 96% 95%    Last Pain:  Vitals:   06/20/19 1149  TempSrc:   PainSc: 3                  Martha Clan

## 2019-06-20 NOTE — Discharge Instructions (Addendum)
Laparoscopic Cholecystectomy, Care After This sheet gives you information about how to care for yourself after your procedure. Your doctor may also give you more specific instructions. If you have problems or questions, contact your doctor. Follow these instructions at home: Care for cuts from surgery (incisions)   Follow instructions from your doctor about how to take care of your cuts from surgery. Make sure you: ? Wash your hands with soap and water before you change your bandage (dressing). If you cannot use soap and water, use hand sanitizer. ? Change your bandage as told by your doctor. ? Leave stitches (sutures), skin glue, or skin tape (adhesive) strips in place. They may need to stay in place for 2 weeks or longer. If tape strips get loose and curl up, you may trim the loose edges. Do not remove tape strips completely unless your doctor says it is okay.  Do not take baths, swim, or use a hot tub until your doctor says it is okay. OK TO SHOWER 24HRS AFTER YOUR SURGERY.   Check your surgical cut area every day for signs of infection. Check for: ? More redness, swelling, or pain. ? More fluid or blood. ? Warmth. ? Pus or a bad smell. Activity  Do not drive or use heavy machinery while taking prescription pain medicine.  Do not play contact sports until your doctor says it is okay.  Do not drive for 24 hours if you were given a medicine to help you relax (sedative).  Rest as needed. Do not return to work or school until your doctor says it is okay. General instructions .  tylenol and advil as needed for discomfort.  Please alternate between the two every four hours as needed for pain.   .  Use narcotics, if prescribed, only when tylenol and motrin is not enough to control pain. .  325-650mg every 8hrs to max of 3000mg/24hrs (including the 325mg in every norco dose) for the tylenol.   .  Advil up to 800mg per dose every 8hrs as needed for pain.    To prevent or treat constipation  while you are taking prescription pain medicine, your doctor may recommend that you: ? Drink enough fluid to keep your pee (urine) clear or pale yellow. ? Take over-the-counter or prescription medicines. ? Eat foods that are high in fiber, such as fresh fruits and vegetables, whole grains, and beans. ? Limit foods that are high in fat and processed sugars, such as fried and sweet foods. Contact a doctor if:  You develop a rash.  You have more redness, swelling, or pain around your surgical cuts.  You have more fluid or blood coming from your surgical cuts.  Your surgical cuts feel warm to the touch.  You have pus or a bad smell coming from your surgical cuts.  You have a fever.  One or more of your surgical cuts breaks open. Get help right away if:  You have trouble breathing.  You have chest pain.  You have pain that is getting worse in your shoulders.  You faint or feel dizzy when you stand.  You have very bad pain in your belly (abdomen).  You are sick to your stomach (nauseous) for more than one day.  You have throwing up (vomiting) that lasts for more than one day.  You have leg pain. This information is not intended to replace advice given to you by your health care provider. Make sure you discuss any questions you have with your   health care provider. Document Released: 11/09/2007 Document Revised: 08/21/2015 Document Reviewed: 07/19/2015 Elsevier Interactive Patient Education  2019 [employer]   AMBULATORY SURGERY  DISCHARGE INSTRUCTIONS   1) The drugs that you were given will stay in your system until tomorrow so for the next 24 hours you should not:  A) Drive an automobile B) Make any legal decisions C) Drink any alcoholic beverage   2) You may resume regular meals tomorrow.  Today it is better to start with liquids and gradually work up to solid foods.  You may eat anything you prefer, but it is better to start with liquids, then soup and crackers,  and gradually work up to solid foods.   3) Please notify your doctor immediately if you have any unusual bleeding, trouble breathing, redness and pain at the surgery site, drainage, fever, or pain not relieved by medication.    4) Additional Instructions:        Please contact your physician with any problems or Same Day Surgery at [phone], Monday through Friday 6 am to 4 pm, or [facility] Main number at [phone]. 

## 2019-06-20 NOTE — Anesthesia Preprocedure Evaluation (Signed)
Anesthesia Evaluation  Patient identified by MRN, date of birth, ID band Patient awake    Reviewed: Allergy & Precautions, H&P , NPO status , Patient's Chart, lab work & pertinent test results, reviewed documented beta blocker date and time   History of Anesthesia Complications Negative for: history of anesthetic complications  Airway Mallampati: III  TM Distance: >3 FB Neck ROM: full    Dental  (+) Partial Upper, Dental Advidsory Given, Poor Dentition, Missing, Caps   Pulmonary neg pulmonary ROS, neg shortness of breath, pneumonia, resolved, COPD, neg recent URI, Current Smoker and Patient abstained from smoking.,    Pulmonary exam normal breath sounds clear to auscultation       Cardiovascular Exercise Tolerance: Good (-) angina(-) Past MI and (-) Cardiac Stents negative cardio ROS  (-) dysrhythmias (-) Valvular Problems/Murmurs Rhythm:regular Rate:Normal     Neuro/Psych PSYCHIATRIC DISORDERS Depression negative neurological ROS  negative psych ROS   GI/Hepatic negative GI ROS, Neg liver ROS, hiatal hernia, GERD  ,  Endo/Other  diabetes  Renal/GU      Musculoskeletal   Abdominal   Peds  Hematology negative hematology ROS (+)   Anesthesia Other Findings Past Medical History: No date: Arthritis 2003: Cancer ([place])     Comment:  left breast No date: Chronic low back pain     Comment:  continuous use of opioids No date: COPD (chronic obstructive pulmonary disease) (HCC) No date: Depression No date: Diabetes mellitus without complication (HCC)     Comment:  diet controlled No date: Emphysema lung (HCC) No date: GERD (gastroesophageal reflux disease) No date: History of hiatal hernia No date: HOH (hard of hearing) No date: Hyperlipidemia No date: Left knee pain No date: Osteoporosis 2003: Personal history of chemotherapy     Comment:  Left breast ca 2003: Personal history of radiation therapy     Comment:   left breast ca No date: Pneumonia   Reproductive/Obstetrics negative OB ROS                             Anesthesia Physical  Anesthesia Plan  ASA: III  Anesthesia Plan: General   Post-op Pain Management:    Induction: Intravenous  PONV Risk Score and Plan: 2 and Ondansetron, Dexamethasone, Midazolam and Treatment may vary due to age or medical condition  Airway Management Planned: Oral ETT  Additional Equipment:   Intra-op Plan:   Post-operative Plan: Extubation in OR  Informed Consent: I have reviewed the patients History and Physical, chart, labs and discussed the procedure including the risks, benefits and alternatives for the proposed anesthesia with the patient or authorized representative who has indicated his/her understanding and acceptance.       Plan Discussed with: CRNA  Anesthesia Plan Comments:         Anesthesia Quick Evaluation

## 2019-06-20 NOTE — Interval H&P Note (Signed)
History and Physical Interval Note:  06/20/2019 7:40 AM  Terri James  has presented today for surgery, with the diagnosis of K81.1 Chronic Cholecystitis.  The various methods of treatment have been discussed with the patient and family. After consideration of risks, benefits and other options for treatment, the patient has consented to  Procedure(s): XI ROBOTIC [name] (N/A) as a surgical intervention.  The patient's history has been reviewed, patient examined, no change in status, stable for surgery.  I have reviewed the patient's chart and labs.  Questions were answered to the patient's satisfaction.     [name]

## 2019-06-20 NOTE — Op Note (Signed)
Preoperative diagnosis:  chronic and cholecystitis  Postoperative diagnosis: same as above  Procedure: Robotic assisted Laparoscopic Cholecystectomy.   Anesthesia: GETA   Surgeon: Benjamine Sprague  Specimen: Gallbladder  Complications: None  EBL: 8mL  Wound Classification: Clean Contaminated  Indications: see HPI  Findings: Critical view of safety noted Cystic duct and artery identified, ligated and divided, clips remained intact at end of procedure Adequate hemostasis  Description of procedure:  The patient was placed on the operating table in the supine position. SCDs placed, pre-op abx administered.  General anesthesia was induced and OG tube placed by anesthesia. A time-out was completed verifying correct patient, procedure, site, positioning, and implant(s) and/or special equipment prior to beginning this procedure. The abdomen was prepped and draped in the usual sterile fashion.    Veress needle was placed at the Palmer's point and insufflation was started after confirming a positive saline drop test and no immediate increase in abdominal pressure.  After reaching 15 mm, the Veress needle was removed and a 8 mm port was placed via optiview technique under umbilicus measured 0000000 from gallbladder.  The abdomen was inspected and no abnormalities or injuries were found.  Under direct vision, ports were placed in the following locations: One 12 mm patient left of the umbilicus, 8cm from the optiviewed port, one 8 mm port placed to the patient right of the umbilical port 8 cm apart.  1 additional 8 mm port placed lateral to the 50mm port.  Once ports were placed, The table was placed in the reverse Trendelenburg position with the right side up. The Xi platform was brought into the operative field and docked to the ports successfully.  An endoscope was placed through the umbilical port, fenestrated grasper through the adjacent patient right port, prograsp to the far patient left port, and  then a hook cautery in the left port.  The dome of the gallbladder was grasped slightly lateral to where the cholecystostomy tube drain was entering at the apex of the gallbladder, with prograsp, passed and retracted over the dome of the liver. Adhesions between the gallbladder and omentum, duodenum and transverse colon were lysed via hook cautery. The infundibulum was grasped with the fenestrated grasper and retracted toward the right lower quadrant. This maneuver exposed Calot's triangle. The peritoneum overlying the gallbladder infundibulum was then dissected using combination of [place] dissector and electrocautery hook and the cystic duct and cystic artery identified.  Critical view of safety with the liver bed clearly visible behind the duct and artery with no additional structures noted.  The cystic duct and cystic artery clipped and divided close to the gallbladder.  2 robotic clips were placed at the base of the cystic duct, one clip for the cystic artery.   The gallbladder was then dissected from its peritoneal and liver bed attachments by electrocautery.  After being removed from the liver bed, the drain suture was cut at the skin level and the drain pulled and removed completely.  Hemostasis was checked prior to removing the hook cautery and the Endo Catch bag was then placed through the 12 mm port and the gallbladder was removed.  The gallbladder was passed off the table as a specimen. There was no evidence of bleeding from the gallbladder fossa or cystic artery or leakage of the bile from the cystic duct stump. The 12 mm port site closed with PMI using 0 vicryl under direct vision.  Abdomen desufflated and secondary trocars were removed under direct vision. No bleeding was noted.  All skin incisions then closed with subcuticular sutures of 4-0 monocryl and dressed with topical skin adhesive. The orogastric tube was removed and patient extubated.  The patient tolerated the procedure well and was  taken to the postanesthesia care unit in stable condition.  All sponge and instrument count correct at end of procedure.

## 2019-06-20 NOTE — Transfer of Care (Signed)
Immediate Anesthesia Transfer of Care Note  Patient: Terri James  Procedure(s) Performed: XI ROBOTIC ASSISTED LAPAROSCOPIC CHOLECYSTECTOMY (N/A Abdomen) INDOCYANINE GREEN FLUORESCENCE IMAGING (ICG)  Patient Location: PACU  Anesthesia Type:General  Level of Consciousness: awake, alert  and oriented  Airway & Oxygen Therapy: Patient Spontanous Breathing and Patient connected to face mask  Post-op Assessment: Report given to RN and Post -op Vital signs reviewed and stable  Post vital signs: Reviewed and stable  Last Vitals:  Vitals Value Taken Time  BP 125/69 06/20/19 1034  Temp    Pulse 89 06/20/19 1035  Resp 17 06/20/19 1035  SpO2 100 % 06/20/19 1035  Vitals shown include unvalidated device data.  Last Pain:  Vitals:   06/20/19 0711  TempSrc: Temporal  PainSc: 0-No pain         Complications: No apparent anesthesia complications

## 2019-06-24 LAB — SURGICAL PATHOLOGY

## 2019-07-08 ENCOUNTER — Other Ambulatory Visit: Payer: Self-pay

## 2019-07-08 ENCOUNTER — Ambulatory Visit
Admission: RE | Admit: 2019-07-08 | Discharge: 2019-07-08 | Disposition: A | Discharge status: Home / Self Care | Payer: Medicare Other | Source: Ambulatory Visit | Attending: Surgery | Admitting: Surgery

## 2019-07-08 ENCOUNTER — Other Ambulatory Visit: Payer: Self-pay | Admitting: Surgery

## 2019-07-08 ENCOUNTER — Other Ambulatory Visit (HOSPITAL_COMMUNITY): Payer: Self-pay | Admitting: Surgery

## 2019-07-08 DIAGNOSIS — I2699 Other pulmonary embolism without acute cor pulmonale: Secondary | ICD-10-CM | POA: Diagnosis present

## 2019-07-08 DIAGNOSIS — R0602 Shortness of breath: Secondary | ICD-10-CM | POA: Insufficient documentation

## 2019-09-24 ENCOUNTER — Other Ambulatory Visit: Payer: Self-pay | Admitting: Internal Medicine

## 2019-09-24 ENCOUNTER — Telehealth: Payer: Self-pay | Admitting: *Deleted

## 2019-09-24 DIAGNOSIS — R7989 Other specified abnormal findings of blood chemistry: Secondary | ICD-10-CM

## 2019-09-24 NOTE — Telephone Encounter (Signed)
(  09/24/2019) Unable to leave message for pt to notify them that it is time to schedule annual low dose lung cancer screening CT scan. Will call back to verify information prior to the scan being scheduled SRW

## 2019-10-01 ENCOUNTER — Ambulatory Visit: Payer: Medicare Other

## 2019-10-06 ENCOUNTER — Ambulatory Visit
Admission: RE | Admit: 2019-10-06 | Discharge: 2019-10-06 | Disposition: A | Discharge status: Home / Self Care | Payer: Medicare Other | Source: Ambulatory Visit | Attending: Internal Medicine | Admitting: Internal Medicine

## 2019-10-06 ENCOUNTER — Other Ambulatory Visit: Payer: Self-pay

## 2019-10-06 DIAGNOSIS — R945 Abnormal results of liver function studies: Secondary | ICD-10-CM | POA: Diagnosis not present

## 2019-10-06 DIAGNOSIS — R7989 Other specified abnormal findings of blood chemistry: Secondary | ICD-10-CM

## 2019-10-25 ENCOUNTER — Telehealth: Payer: Self-pay | Admitting: *Deleted

## 2019-10-25 DIAGNOSIS — Z87891 Personal history of nicotine dependence: Secondary | ICD-10-CM

## 2019-10-25 DIAGNOSIS — Z122 Encounter for screening for malignant neoplasm of respiratory organs: Secondary | ICD-10-CM

## 2019-10-25 NOTE — Telephone Encounter (Signed)
Left a voicemail to inform patient that it is time to schedule her lung cancer screening scan. Instructed her to call back to update information and schedule her CT scan.

## 2019-10-27 NOTE — Telephone Encounter (Signed)
Contacted and scheduled 

## 2019-10-27 NOTE — Addendum Note (Signed)
Addended by: Lieutenant Diego on: 10/27/2019 10:46 AM   Modules accepted: Orders

## 2019-11-03 ENCOUNTER — Other Ambulatory Visit: Payer: Self-pay

## 2019-11-03 ENCOUNTER — Ambulatory Visit
Admission: RE | Admit: 2019-11-03 | Discharge: 2019-11-03 | Disposition: A | Discharge status: Home / Self Care | Payer: Medicare Other | Source: Ambulatory Visit | Attending: Nurse Practitioner | Admitting: Nurse Practitioner

## 2019-11-03 DIAGNOSIS — Z87891 Personal history of nicotine dependence: Secondary | ICD-10-CM | POA: Insufficient documentation

## 2019-11-03 DIAGNOSIS — Z122 Encounter for screening for malignant neoplasm of respiratory organs: Secondary | ICD-10-CM

## 2019-11-05 ENCOUNTER — Encounter: Payer: Self-pay | Admitting: *Deleted

## 2019-11-24 ENCOUNTER — Encounter: Payer: Self-pay | Admitting: Emergency Medicine

## 2019-11-24 ENCOUNTER — Ambulatory Visit
Admission: EM | Admit: 2019-11-24 | Discharge: 2019-11-24 | Disposition: A | Discharge status: Home / Self Care | Payer: Medicare Other | Attending: Physician Assistant | Admitting: Physician Assistant

## 2019-11-24 ENCOUNTER — Other Ambulatory Visit: Payer: Self-pay

## 2019-11-24 DIAGNOSIS — Z20822 Contact with and (suspected) exposure to covid-19: Secondary | ICD-10-CM | POA: Diagnosis not present

## 2019-11-24 DIAGNOSIS — J029 Acute pharyngitis, unspecified: Secondary | ICD-10-CM | POA: Insufficient documentation

## 2019-11-24 DIAGNOSIS — J441 Chronic obstructive pulmonary disease with (acute) exacerbation: Secondary | ICD-10-CM | POA: Diagnosis not present

## 2019-11-24 DIAGNOSIS — R059 Cough, unspecified: Secondary | ICD-10-CM | POA: Diagnosis present

## 2019-11-24 LAB — GROUP A STREP BY PCR: Group A Strep by PCR: NOT DETECTED

## 2019-11-24 MED ORDER — PREDNISONE 20 MG PO TABS: 40.0000 mg | ORAL_TABLET | Freq: Every day | ORAL | 0 refills | Status: AC

## 2019-11-24 MED ORDER — AZITHROMYCIN 250 MG PO TABS: 250.0000 mg | ORAL_TABLET | Freq: Every day | ORAL | 0 refills | Status: DC

## 2019-11-24 NOTE — Discharge Instructions (Signed)

## 2019-11-24 NOTE — ED Triage Notes (Signed)
Patient c/o cough and sore throat that started yesterday. Denies fever.

## 2019-11-24 NOTE — ED Provider Notes (Signed)
[facility]    CSN: [id number] Arrival date & time: 11/24/19  1308      History   Chief Complaint Chief Complaint  Patient presents with  . Cough  . Sore Throat    HPI Terri James is a 69 y.o. female presenting for onset of sore throat and cough yesterday.  Says the cough is mildly productive at times.  Patient denies any known Covid exposure and states she is fully vaccinated for COVID-19 and is also had the booster vaccine.  Patient denies fever, fatigue, body aches, chest pain, shortness of breath. Patient's past medical history significant for COPD, hyperlipidemia, breast cancer in remission.  She denies having to use her inhalers more frequently.  Patient says that she is always seen and says she gets sick so to prevent exacerbations of her COPD.  No other complaints or concerns today.  HPI  Past Medical History:  Diagnosis Date  . Arthritis   . Cancer [facility]) 2003   left breast  . Chronic low back pain    continuous use of opioids  . COPD (chronic obstructive pulmonary disease) ([place])   . Depression   . Diabetes mellitus without complication (HCC)    diet controlled  . Emphysema lung ([place])   . GERD (gastroesophageal reflux disease)   . History of hiatal hernia   . HOH (hard of hearing)   . Hyperlipidemia   . Left knee pain   . Osteoporosis   . Personal history of chemotherapy 2003   Left breast ca  . Personal history of radiation therapy 2003   left breast ca  . Pneumonia     Patient Active Problem List   Diagnosis Date Noted  . Cholecystitis 05/12/2019  . Acute cholecystitis 05/07/2019    Past Surgical History:  Procedure Laterality Date  . ABDOMINAL HYSTERECTOMY    . BACK SURGERY  2002   fusion with screws, second surgery/  . BREAST EXCISIONAL BIOPSY Left 2003   positive  . BREAST LUMPECTOMY Left 2003   f/u with chemo and radiation   . CATARACT EXTRACTION W/PHACO Right 08/24/2015   Procedure: CATARACT EXTRACTION PHACO AND  INTRAOCULAR LENS PLACEMENT (IOC);  Surgeon: Birder Robson, MD;  Location: [facility] ORS;  Service: Ophthalmology;  Laterality: Right;  [place] 1.02 AP% 18.2 CDE 11.28 Fluid Pack lot # [id number]  . CATARACT EXTRACTION W/PHACO Left 05/09/2016   Procedure: CATARACT EXTRACTION PHACO AND INTRAOCULAR LENS PLACEMENT (IOC);  Surgeon: Birder Robson, MD;  Location: [facility] ORS;  Service: Ophthalmology;  Laterality: Left;  [place] 00:49 AP% 17.2 CDE 8.45 Fluid pack lot # [id number]  . COLONOSCOPY WITH PROPOFOL N/A 08/06/2015   Procedure: COLONOSCOPY WITH PROPOFOL;  Surgeon: Manya Silvas, MD;  Location: [facility] ENDOSCOPY;  Service: Endoscopy;  Laterality: N/A;  . DILATION AND CURETTAGE OF UTERUS    . ESOPHAGOGASTRODUODENOSCOPY (EGD) WITH PROPOFOL N/A 08/06/2015   Procedure: ESOPHAGOGASTRODUODENOSCOPY (EGD) WITH PROPOFOL;  Surgeon: Manya Silvas, MD;  Location: [facility] ENDOSCOPY;  Service: Endoscopy;  Laterality: N/A;  . ganglion cyst removal Left   . POSTERIOR FUSION LUMBAR SPINE     x2  . TONSILLECTOMY      OB History   No obstetric history on file.      Home Medications    Prior to Admission medications   Medication Sig Start Date End Date Taking? Authorizing Provider  acetaminophen (TYLENOL) 325 MG tablet Take 650 mg by mouth 2 (two) times daily as needed for moderate pain or headache.  Yes [provider]  Calcium Carbonate-Vitamin D 600-125 MG-UNIT TABS Take 1 tablet by mouth daily.   Yes [provider]  clonazePAM (KLONOPIN) 0.5 MG tablet Take 0.25 mg by mouth daily as needed for anxiety.    Yes [provider]  cyclobenzaprine (FLEXERIL) 10 MG tablet Take 10 mg by mouth at bedtime.    Yes [provider]  gabapentin (NEURONTIN) 600 MG tablet Take 600 mg by mouth 4 (four) times daily.   Yes [provider]  ibuprofen (ADVIL) 800 MG tablet Take 1 tablet (800 mg total) by mouth every 8 (eight) hours as needed for mild pain or moderate pain. 06/20/19  Yes Sakai,  Isami, DO  morphine (MS CONTIN) 15 MG 12 hr tablet Take 15 mg by mouth every 8 (eight) hours.   Yes [provider]  Multiple Vitamin (MULTIVITAMIN WITH MINERALS) TABS tablet Take 1 tablet by mouth daily.   Yes [provider]  pantoprazole (PROTONIX) 40 MG tablet Take 40 mg by mouth daily.   Yes [provider]  simvastatin (ZOCOR) 40 MG tablet Take 40 mg by mouth at bedtime.    Yes [provider]  azithromycin (ZITHROMAX) 250 MG tablet Take 1 tablet (250 mg total) by mouth daily. Take first 2 tablets together, then 1 every day until finished. 11/24/19   Danton Clap, PA-C  HYDROcodone-acetaminophen (NORCO) 5-325 MG tablet Take 1 tablet by mouth every 6 (six) hours as needed for up to 6 doses for moderate pain. 06/20/19   Lysle Pearl, Isami, DO  predniSONE (DELTASONE) 20 MG tablet Take 2 tablets (40 mg total) by mouth daily for 5 days. 11/24/19 11/29/19  Danton Clap, PA-C    Family History Family History  Problem Relation Age of Onset  . Heart disease Mother   . Hypertension Mother   . Diabetes Father   . Macular degeneration Father   . Hypertension Father     Social History Social History   Tobacco Use  . Smoking status: Current Every Day Smoker    Packs/day: 0.50    Years: 46.00    Pack years: 23.00    Types: Cigarettes  . Smokeless tobacco: Never Used  Vaping Use  . Vaping Use: Never used  Substance Use Topics  . Alcohol use: Yes    Comment: rarely  . Drug use: No     Allergies   Patient has no known allergies.   Review of Systems Review of Systems  Constitutional: Negative for chills, diaphoresis, fatigue and fever.  HENT: Positive for sore throat. Negative for congestion, ear pain, rhinorrhea, sinus pressure and sinus pain.   Respiratory: Positive for cough. Negative for shortness of breath.   Gastrointestinal: Negative for abdominal pain, nausea and vomiting.  Musculoskeletal: Negative for arthralgias and myalgias.  Skin:  Negative for rash.  Neurological: Negative for weakness and headaches.  Hematological: Negative for adenopathy.     Physical Exam Triage Vital Signs ED Triage Vitals  Enc Vitals Group     BP 11/24/19 1422 107/67     Pulse Rate 11/24/19 1422 94     Resp 11/24/19 1422 18     Temp 11/24/19 1422 98.6 F (37 C)     Temp Source 11/24/19 1422 Oral     SpO2 11/24/19 1422 96 %     Weight 11/24/19 1420 134 lb (60.8 kg)     Height 11/24/19 1420 5\' 5"  (1.651 m)     Head Circumference --  Peak Flow --      Pain Score 11/24/19 1419 6     Pain Loc --      Pain Edu? --      Excl. in [place]? --    No data found.  Updated Vital Signs BP 107/67 (BP Location: Left Arm)   Pulse 94   Temp 98.6 F (37 C) (Oral)   Resp 18   Ht 5\' 5"  (1.651 m)   Wt 134 lb (60.8 kg)   SpO2 96%   BMI 22.30 kg/m    Physical Exam Vitals and nursing note reviewed.  Constitutional:      General: She is not in acute distress.    Appearance: Normal appearance. She is not ill-appearing or toxic-appearing.  HENT:     Head: Normocephalic and atraumatic.     Nose: Congestion present.     Mouth/Throat:     Mouth: Mucous membranes are moist.     Pharynx: Oropharynx is clear. Posterior oropharyngeal erythema (clear PND) present.  Eyes:     General: No scleral icterus.       Right eye: No discharge.        Left eye: No discharge.     Conjunctiva/sclera: Conjunctivae normal.  Cardiovascular:     Rate and Rhythm: Normal rate and regular rhythm.     Heart sounds: Normal heart sounds.  Pulmonary:     Effort: Pulmonary effort is normal. No respiratory distress.     Breath sounds: Wheezing (few scattered inspiratory wheezes) present.  Musculoskeletal:     Cervical back: Neck supple.  Skin:    General: Skin is dry.  Neurological:     General: No focal deficit present.     Mental Status: She is alert. Mental status is at baseline.     Motor: No weakness.     Gait: Gait normal.  Psychiatric:        Mood and  Affect: Mood normal.        Behavior: Behavior normal.        Thought Content: Thought content normal.      UC Treatments / Results  Labs (all labs ordered are listed, but only abnormal results are displayed) Labs Reviewed  GROUP A STREP BY PCR  SARS CORONAVIRUS 2 (TAT 6-24 HRS)    EKG   Radiology No results found.  Procedures Procedures (including critical care time)  Medications Ordered in UC Medications - No data to display  Initial Impression / Assessment and Plan / UC Course  I have reviewed the triage vital signs and the nursing notes.  Pertinent labs & imaging results that were available during my care of the patient were reviewed by me and considered in my medical decision making (see chart for details).   All vital signs normal and stable.  Oxygen 96%.  Chest test revealed multiple minor wheezes throughout.  She is not in any distress and is well-appearing.  Negative rapid strep test.  Treating at this time for suspected COPD exacerbation with azithromycin and prednisone.  Covid testing obtained.  CDC guidelines, isolation protocol and ED precautions discussed if positive.  Advised her to use inhalers as needed, rest increase fluid intake.  Follow-up for any new or worsening symptoms including if she develops a fever or breathing difficulty.  Patient agreeable.  Final Clinical Impressions(s) / UC Diagnoses   Final diagnoses:  COPD exacerbation ([place])  Sore throat  Cough     Discharge Instructions     You have received COVID  testing today either for positive exposure, concerning symptoms that could be related to COVID infection, screening purposes, or re-testing after confirmed positive.  Your test obtained today checks for active viral infection in the last 1-2 weeks. If your test is negative now, you can still test positive later. So, if you do develop symptoms you should either get re-tested and/or isolate x 10 days. Please follow CDC guidelines.  While Rapid  antigen tests come back in 15-20 minutes, send out PCR/molecular test results typically come back within 24 hours. In the mean time, if you are symptomatic, assume this could be a positive test and treat/monitor yourself as if you do have COVID.   We will call with test results. Please download the MyChart app and set up a profile to access test results.   If symptomatic, go home and rest. Push fluids. Take Tylenol as needed for discomfort. Gargle warm salt water. Throat lozenges. Take Mucinex DM or Robitussin for cough. Humidifier in bedroom to ease coughing. Warm showers. Also review the COVID handout for more information.  COVID-19 INFECTION: The incubation period of COVID-19 is approximately 14 days after exposure, with most symptoms developing in roughly 4-5 days. Symptoms may range in severity from mild to critically severe. Roughly 80% of those infected will have mild symptoms. People of any age may become infected with COVID-19 and have the ability to transmit the virus. The most common symptoms include: fever, fatigue, cough, body aches, headaches, sore throat, nasal congestion, shortness of breath, nausea, vomiting, diarrhea, changes in smell and/or taste.    COURSE OF ILLNESS Some patients may begin with mild disease which can progress quickly into critical symptoms. If your symptoms are worsening please call ahead to the Emergency Department and proceed there for further treatment. Recovery time appears to be roughly 1-2 weeks for mild symptoms and 3-6 weeks for severe disease.   GO IMMEDIATELY TO ER FOR FEVER YOU ARE UNABLE TO GET DOWN WITH TYLENOL, BREATHING PROBLEMS, CHEST PAIN, FATIGUE, LETHARGY, INABILITY TO EAT OR DRINK, ETC  QUARANTINE AND ISOLATION: To help decrease the spread of COVID-19 please remain isolated if you have COVID infection or are highly suspected to have COVID infection. This means -stay home and isolate to one room in the home if you live with others. Do not share a  bed or bathroom with others while ill, sanitize and wipe down all countertops and keep common areas clean and disinfected. You may discontinue isolation if you have a mild case and are asymptomatic 10 days after symptom onset as long as you have been fever free >24 hours without having to take Motrin or Tylenol. If your case is more severe (meaning you develop pneumonia or are admitted in the hospital), you may have to isolate longer.   If you have been in close contact (within 6 feet) of someone diagnosed with COVID 19, you are advised to quarantine in your home for 14 days as symptoms can develop anywhere from 2-14 days after exposure to the virus. If you develop symptoms, you  must isolate.  Most current guidelines for COVID after exposure -isolate 10 days if you ARE NOT tested for COVID as long as symptoms do not develop -isolate 7 days if you are tested and remain asymptomatic -You do not necessarily need to be tested for COVID if you have + exposure and        develop   symptoms. Just isolate at home x10 days from symptom onset During this global pandemic,  CDC advises to practice social distancing, try to stay at least 26ft away from others at all times. Wear a face covering. Wash and sanitize your hands regularly and avoid going anywhere that is not necessary.  KEEP IN MIND THAT THE COVID TEST IS NOT 100% ACCURATE AND YOU SHOULD STILL DO EVERYTHING TO PREVENT POTENTIAL SPREAD OF VIRUS TO OTHERS (WEAR MASK, WEAR GLOVES, [place] HANDS AND SANITIZE REGULARLY). IF INITIAL TEST IS NEGATIVE, THIS MAY NOT MEAN YOU ARE DEFINITELY NEGATIVE. MOST ACCURATE TESTING IS DONE 5-7 DAYS AFTER EXPOSURE.   It is not advised by CDC to get re-tested after receiving a positive COVID test since you can still test positive for weeks to months after you have already cleared the virus.   *If you have not been vaccinated for COVID, I strongly suggest you consider getting vaccinated as long as there are no contraindications.        ED Prescriptions    Medication Sig Dispense Auth. Provider   azithromycin (ZITHROMAX) 250 MG tablet Take 1 tablet (250 mg total) by mouth daily. Take first 2 tablets together, then 1 every day until finished. 6 tablet Laurene Footman B, PA-C   predniSONE (DELTASONE) 20 MG tablet Take 2 tablets (40 mg total) by mouth daily for 5 days. 10 tablet Gretta Cool     PDMP not reviewed this encounter.   Danton Clap, PA-C 11/25/19 [phone]

## 2019-11-25 LAB — SARS CORONAVIRUS 2 (TAT 6-24 HRS): SARS Coronavirus 2: NEGATIVE

## 2020-01-27 ENCOUNTER — Other Ambulatory Visit: Payer: Self-pay | Admitting: Internal Medicine

## 2020-01-27 DIAGNOSIS — Z1231 Encounter for screening mammogram for malignant neoplasm of breast: Secondary | ICD-10-CM

## 2020-03-09 ENCOUNTER — Ambulatory Visit
Admission: RE | Admit: 2020-03-09 | Discharge: 2020-03-09 | Disposition: A | Discharge status: Home / Self Care | Payer: Medicare Other | Source: Ambulatory Visit | Attending: Internal Medicine | Admitting: Internal Medicine

## 2020-03-09 ENCOUNTER — Other Ambulatory Visit: Payer: Self-pay

## 2020-03-09 DIAGNOSIS — Z1231 Encounter for screening mammogram for malignant neoplasm of breast: Secondary | ICD-10-CM | POA: Insufficient documentation

## 2021-03-25 ENCOUNTER — Encounter: Payer: Self-pay | Admitting: Gastroenterology

## 2021-03-27 ENCOUNTER — Encounter: Payer: Self-pay | Admitting: Gastroenterology

## 2021-03-27 NOTE — H&P (Signed)
Pre-Procedure H&P   Patient ID: Terri James is a 71 y.o. female.  Gastroenterology Provider: Annamaria Helling, DO  Referring Provider: Dawson Bills, NP PCP: Kirk Ruths, MD  Date: 03/28/2021  HPI Terri James is a 71 y.o. female who presents today for Esophagogastroduodenoscopy and Colonoscopy for gerd, new onset dysphagia, fhx colon polyps and CRC.  Pt has noted over the last 3 months- solid food dysphagia- specifically breads occurring 2-3x/m. Notes supraclavicular sticking. No odynophagia, weight changes or appetite changes. Currently taking protonix 40 mg w/o improvement. Terri James is controlled.  Normal B w/o blood, diarrhea or constipation. Last colonoscopy in 2017 along with egd. Only noting gastritis and negative for hp. Other colonoscopy in 2012- normal.  No other actue g complaints.  Father and sister with colon polyps.  Past Medical History:  Diagnosis Date   Arthritis    Cancer ([place]) 2003   left breast   Chronic low back pain    continuous use of opioids   COPD (chronic obstructive pulmonary disease) (HCC)    Depression    Diabetes mellitus without complication (HCC)    diet controlled   Emphysema lung (HCC)    GERD (gastroesophageal reflux disease)    History of hiatal hernia    HOH (hard of hearing)    Hyperlipidemia    Left knee pain    Osteoporosis    Personal history of chemotherapy 2003   Left breast ca   Personal history of radiation therapy 2003   left breast ca   Pneumonia     Past Surgical History:  Procedure Laterality Date   ABDOMINAL HYSTERECTOMY     BACK SURGERY  2002   fusion with screws, second surgery/   BREAST EXCISIONAL BIOPSY Left 2003   positive   BREAST LUMPECTOMY Left 2003   f/u with chemo and radiation    CATARACT EXTRACTION W/PHACO Right 08/24/2015   Procedure: CATARACT EXTRACTION PHACO AND INTRAOCULAR LENS PLACEMENT ([place]);  Surgeon: Birder Robson, MD;  Location: [facility] ORS;  Service: Ophthalmology;   Laterality: Right;  [place] 1.02 AP% 18.2 CDE 11.28 Fluid Pack lot # [id number]   CATARACT EXTRACTION W/PHACO Left 05/09/2016   Procedure: CATARACT EXTRACTION PHACO AND INTRAOCULAR LENS PLACEMENT ([facility]);  Surgeon: Birder Robson, MD;  Location: [facility] ORS;  Service: Ophthalmology;  Laterality: Left;  [place] 00:49 AP% 17.2 CDE 8.45 Fluid pack lot # [id number]   COLONOSCOPY WITH PROPOFOL N/A 08/06/2015   Procedure: COLONOSCOPY WITH PROPOFOL;  Surgeon: Manya Silvas, MD;  Location: [facility] ENDOSCOPY;  Service: Endoscopy;  Laterality: N/A;   DILATION AND CURETTAGE OF UTERUS     ESOPHAGOGASTRODUODENOSCOPY (EGD) WITH PROPOFOL N/A 08/06/2015   Procedure: ESOPHAGOGASTRODUODENOSCOPY (EGD) WITH PROPOFOL;  Surgeon: Manya Silvas, MD;  Location: [facility] ENDOSCOPY;  Service: Endoscopy;  Laterality: N/A;   ganglion cyst removal Left    POSTERIOR FUSION LUMBAR SPINE     x2   TONSILLECTOMY      Family History Father and sister- colon polyps No h/o GI disease or malignancy  Review of Systems  Constitutional:  Negative for activity change, appetite change, chills, diaphoresis, fatigue, fever and unexpected weight change.  HENT:  Positive for trouble swallowing (solids, suprasternal notch sticking). Negative for voice change.   Respiratory:  Negative for shortness of breath and wheezing.   Cardiovascular:  Negative for chest pain, palpitations and leg swelling.  Gastrointestinal:  Negative for abdominal distention, abdominal pain, anal bleeding, blood in stool, constipation, diarrhea, nausea, rectal pain and vomiting.  Reflux  Musculoskeletal:        Chronic pain  Skin:  Negative for color change and pallor.  Neurological:  Negative for dizziness, syncope and weakness.  Psychiatric/Behavioral:  Negative for confusion.   All other systems reviewed and are negative.   Medications No current facility-administered medications on file prior to encounter.   Current Outpatient Medications on File Prior to  Encounter  Medication Sig Dispense Refill   clonazePAM (KLONOPIN) 0.5 MG tablet Take 0.25 mg by mouth daily as needed for anxiety.      gabapentin (NEURONTIN) 600 MG tablet Take 600 mg by mouth 4 (four) times daily.     morphine (MS CONTIN) 15 MG 12 hr tablet Take 15 mg by mouth every 8 (eight) hours.     pantoprazole (PROTONIX) 40 MG tablet Take 40 mg by mouth daily.     simvastatin (ZOCOR) 40 MG tablet Take 40 mg by mouth at bedtime.      acetaminophen (TYLENOL) 325 MG tablet Take 650 mg by mouth 2 (two) times daily as needed for moderate pain or headache.      azithromycin (ZITHROMAX) 250 MG tablet Take 1 tablet (250 mg total) by mouth daily. Take first 2 tablets together, then 1 every day until finished. (Patient not taking: Reported on 03/28/2021) 6 tablet 0   Calcium Carbonate-Vitamin D 600-125 MG-UNIT TABS Take 1 tablet by mouth daily.     cyclobenzaprine (FLEXERIL) 10 MG tablet Take 10 mg by mouth at bedtime.      HYDROcodone-acetaminophen (NORCO) 5-325 MG tablet Take 1 tablet by mouth every 6 (six) hours as needed for up to 6 doses for moderate pain. 6 tablet 0   ibuprofen (ADVIL) 800 MG tablet Take 1 tablet (800 mg total) by mouth every 8 (eight) hours as needed for mild pain or moderate pain. 30 tablet 0   Multiple Vitamin (MULTIVITAMIN WITH MINERALS) TABS tablet Take 1 tablet by mouth daily.      Pertinent medications related to GI and procedure were reviewed by me with the patient prior to the procedure   Current Facility-Administered Medications:    0.9 %  sodium chloride infusion, , Intravenous, Continuous, Annamaria Helling, DO, Last Rate: 20 mL/hr at 03/28/21 0905, New Bag at 03/28/21 0905      No Known Allergies Allergies were reviewed by me prior to the procedure  Objective    Vitals:   03/28/21 0852  BP: 127/81  Pulse: 97  Resp: 18  Temp: (!) 97.5 F (36.4 C)  TempSrc: Temporal  SpO2: 98%  Weight: 60.3 kg  Height: 5\' 5"  (1.651 m)    Physical  Exam Vitals and nursing note reviewed.  Constitutional:      General: She is not in acute distress.    Appearance: Normal appearance. She is not ill-appearing, toxic-appearing or diaphoretic.  HENT:     Head: Normocephalic and atraumatic.     Nose: Nose normal.     Mouth/Throat:     Mouth: Mucous membranes are moist.     Pharynx: Oropharynx is clear.  Eyes:     General: No scleral icterus.    Extraocular Movements: Extraocular movements intact.  Cardiovascular:     Rate and Rhythm: Normal rate and regular rhythm.     Heart sounds: Normal heart sounds. No murmur heard.   No friction rub. No gallop.  Pulmonary:     Effort: Pulmonary effort is normal. No respiratory distress.     Breath sounds: Normal breath sounds. No  wheezing, rhonchi or rales.  Abdominal:     General: Bowel sounds are normal. There is no distension.     Palpations: Abdomen is soft.     Tenderness: There is no abdominal tenderness. There is no guarding or rebound.  Musculoskeletal:     Cervical back: Neck supple.     Right lower leg: No edema.     Left lower leg: No edema.  Skin:    General: Skin is warm and dry.     Coloration: Skin is not jaundiced or pale.  Neurological:     General: No focal deficit present.     Mental Status: She is alert and oriented to person, place, and time. Mental status is at baseline.  Psychiatric:        Mood and Affect: Mood normal.        Behavior: Behavior normal.        Thought Content: Thought content normal.        Judgment: Judgment normal.     Assessment:  Ms. PRINCESSA LESMEISTER is a 71 y.o. female  who presents today for Esophagogastroduodenoscopy and Colonoscopy for gerd, dysphagia, fhx colon polyps and crc.  Plan:  Esophagogastroduodenoscopy and Colonoscopy with possible intervention today  Esophagogastroduodenoscopy and Colonoscopy with possible biopsy, control of bleeding, polypectomy, and interventions as necessary has been discussed with the patient/patient  representative. Informed consent was obtained from the patient/patient representative after explaining the indication, nature, and risks of the procedure including but not limited to death, bleeding, perforation, missed neoplasm/lesions, cardiorespiratory compromise, and reaction to medications. Opportunity for questions was given and appropriate answers were provided. Patient/patient representative has verbalized understanding is amenable to undergoing the procedure.   Annamaria Helling, DO  [facility]  Portions of the record may have been created with voice recognition software. Occasional wrong-word or 'sound-a-like' substitutions may have occurred due to the inherent limitations of voice recognition software.  Read the chart carefully and recognize, using context, where substitutions may have occurred.

## 2021-03-28 ENCOUNTER — Encounter: Payer: Self-pay | Admitting: Gastroenterology

## 2021-03-28 ENCOUNTER — Ambulatory Visit: Payer: Medicare Other | Admitting: Certified Registered Nurse Anesthetist

## 2021-03-28 ENCOUNTER — Encounter
Admission: RE | Disposition: A | Discharge status: Home / Self Care | Payer: Self-pay | Source: Home / Self Care | Attending: Gastroenterology

## 2021-03-28 ENCOUNTER — Ambulatory Visit
Admission: RE | Admit: 2021-03-28 | Discharge: 2021-03-28 | Disposition: A | Discharge status: Home / Self Care | Payer: Medicare Other | Attending: Gastroenterology | Admitting: Gastroenterology

## 2021-03-28 ENCOUNTER — Other Ambulatory Visit: Payer: Self-pay

## 2021-03-28 DIAGNOSIS — K2100 Gastro-esophageal reflux disease with esophagitis, without bleeding: Secondary | ICD-10-CM | POA: Diagnosis not present

## 2021-03-28 DIAGNOSIS — D124 Benign neoplasm of descending colon: Secondary | ICD-10-CM | POA: Diagnosis not present

## 2021-03-28 DIAGNOSIS — Z87891 Personal history of nicotine dependence: Secondary | ICD-10-CM | POA: Insufficient documentation

## 2021-03-28 DIAGNOSIS — E785 Hyperlipidemia, unspecified: Secondary | ICD-10-CM | POA: Insufficient documentation

## 2021-03-28 DIAGNOSIS — Z8371 Family history of colonic polyps: Secondary | ICD-10-CM | POA: Insufficient documentation

## 2021-03-28 DIAGNOSIS — K449 Diaphragmatic hernia without obstruction or gangrene: Secondary | ICD-10-CM | POA: Insufficient documentation

## 2021-03-28 DIAGNOSIS — D122 Benign neoplasm of ascending colon: Secondary | ICD-10-CM | POA: Insufficient documentation

## 2021-03-28 DIAGNOSIS — K2970 Gastritis, unspecified, without bleeding: Secondary | ICD-10-CM | POA: Insufficient documentation

## 2021-03-28 DIAGNOSIS — K640 First degree hemorrhoids: Secondary | ICD-10-CM | POA: Insufficient documentation

## 2021-03-28 DIAGNOSIS — Z1211 Encounter for screening for malignant neoplasm of colon: Secondary | ICD-10-CM | POA: Insufficient documentation

## 2021-03-28 DIAGNOSIS — K224 Dyskinesia of esophagus: Secondary | ICD-10-CM | POA: Insufficient documentation

## 2021-03-28 DIAGNOSIS — Z79899 Other long term (current) drug therapy: Secondary | ICD-10-CM | POA: Diagnosis not present

## 2021-03-28 DIAGNOSIS — R1310 Dysphagia, unspecified: Secondary | ICD-10-CM | POA: Insufficient documentation

## 2021-03-28 DIAGNOSIS — E119 Type 2 diabetes mellitus without complications: Secondary | ICD-10-CM | POA: Insufficient documentation

## 2021-03-28 DIAGNOSIS — J439 Emphysema, unspecified: Secondary | ICD-10-CM | POA: Diagnosis not present

## 2021-03-28 DIAGNOSIS — F32A Depression, unspecified: Secondary | ICD-10-CM | POA: Diagnosis not present

## 2021-03-28 DIAGNOSIS — D127 Benign neoplasm of rectosigmoid junction: Secondary | ICD-10-CM | POA: Insufficient documentation

## 2021-03-28 HISTORY — PX: ESOPHAGOGASTRODUODENOSCOPY: SHX5428

## 2021-03-28 HISTORY — PX: COLONOSCOPY: SHX5424

## 2021-03-28 SURGERY — COLONOSCOPY
Anesthesia: General

## 2021-03-28 MED ORDER — SODIUM CHLORIDE 0.9 % IV SOLN: INTRAVENOUS | Status: DC

## 2021-03-28 MED ORDER — PROPOFOL 10 MG/ML IV BOLUS
INTRAVENOUS | Status: DC | PRN
  Administered 2021-03-28: 30 mg via INTRAVENOUS
  Administered 2021-03-28: 70 mg via INTRAVENOUS

## 2021-03-28 MED ORDER — PROPOFOL 500 MG/50ML IV EMUL
INTRAVENOUS | Status: AC
  Filled 2021-03-28: qty 50

## 2021-03-28 MED ORDER — EPHEDRINE 5 MG/ML INJ
INTRAVENOUS | Status: AC
  Filled 2021-03-28: qty 5

## 2021-03-28 MED ORDER — LIDOCAINE HCL (PF) 2 % IJ SOLN
INTRAMUSCULAR | Status: AC
  Filled 2021-03-28: qty 30

## 2021-03-28 MED ORDER — LIDOCAINE HCL (CARDIAC) PF 100 MG/5ML IV SOSY
PREFILLED_SYRINGE | INTRAVENOUS | Status: DC | PRN
Start: 2021-03-28 — End: 2021-03-28
  Administered 2021-03-28: 100 mg via INTRAVENOUS

## 2021-03-28 MED ORDER — PROPOFOL 500 MG/50ML IV EMUL
INTRAVENOUS | Status: DC | PRN
  Administered 2021-03-28: 150 ug/kg/min via INTRAVENOUS

## 2021-03-28 MED ORDER — PHENYLEPHRINE 40 MCG/ML (10ML) SYRINGE FOR IV PUSH (FOR BLOOD PRESSURE SUPPORT)
PREFILLED_SYRINGE | INTRAVENOUS | Status: DC | PRN
Start: 2021-03-28 — End: 2021-03-28
  Administered 2021-03-28 (×6): 80 ug via INTRAVENOUS

## 2021-03-28 MED ORDER — GLYCOPYRROLATE 0.2 MG/ML IJ SOLN
INTRAMUSCULAR | Status: AC
  Filled 2021-03-28: qty 1

## 2021-03-28 NOTE — Transfer of Care (Signed)
Immediate Anesthesia Transfer of Care Note  Patient: Terri James  Procedure(s) Performed: COLONOSCOPY ESOPHAGOGASTRODUODENOSCOPY (EGD)  Patient Location: Endoscopy Unit  Anesthesia Type:General  Level of Consciousness: awake, alert  and oriented  Airway & Oxygen Therapy: Patient Spontanous Breathing  Post-op Assessment: Report given to RN and Post -op Vital signs reviewed and stable  Post vital signs: Reviewed and stable  Last Vitals:  Vitals Value Taken Time  BP 93/62   Temp    Pulse 79 03/28/21 1052  Resp 15   SpO2 100 % 03/28/21 1052  Vitals shown include unvalidated device data.  Last Pain:  Vitals:   03/28/21 0852  TempSrc: Temporal  PainSc: 0-No pain         Complications: No notable events documented.

## 2021-03-28 NOTE — Anesthesia Procedure Notes (Signed)
Date/Time: 03/28/2021 9:52 AM Performed by: Lily Peer, Myrene Bougher, CRNA Pre-anesthesia Checklist: Patient identified, Emergency Drugs available, Suction available, Patient being monitored and Timeout performed Patient Re-evaluated:Patient Re-evaluated prior to induction Oxygen Delivery Method: Simple face mask Induction Type: IV induction

## 2021-03-28 NOTE — Interval H&P Note (Signed)
History and Physical Interval Note: Preprocedure H&P from 03/28/21  was reviewed and there was no interval change after seeing and examining the patient.  Written consent was obtained from the patient after discussion of risks, benefits, and alternatives. Patient has consented to proceed with Esophagogastroduodenoscopy and Colonoscopy with possible intervention   03/28/2021 9:46 AM  Terri James  has presented today for surgery, with the diagnosis of Fam hx colon polyps (Z83.71) Gastroesophageal reflux disease, unspecified whether esophagitis present (K21.9) Dysphagia, unspecified type (R13.10).  The various methods of treatment have been discussed with the patient and family. After consideration of risks, benefits and other options for treatment, the patient has consented to  Procedure(s) with comments: COLONOSCOPY (N/A) - DIET CONTROLLED ESOPHAGOGASTRODUODENOSCOPY (EGD) (N/A) as a surgical intervention.  The patient's history has been reviewed, patient examined, no change in status, stable for surgery.  I have reviewed the patient's chart and labs.  Questions were answered to the patient's satisfaction.     Annamaria Helling

## 2021-03-28 NOTE — Op Note (Signed)
[facility] Patient Name: Terri James Procedure Date: 03/28/2021 9:41 AM MRN: [id number] Account #: [id number] Date of Birth: 10/26/1950 Admit Type: Outpatient Age: 71 Room: [facility] ENDO ROOM 2 Gender: Female Note Status: Finalized Instrument Name: Altamese Cabal Endoscope [id number] Procedure:             Upper GI endoscopy Indications:           Dysphagia Providers:             Rueben Bash, DO Referring MD:          Ocie Cornfield. Ouida Sills MD, MD (Referring MD) Medicines:             Monitored Anesthesia Care Complications:         No immediate complications. Estimated blood loss:                         Minimal. Procedure:             Pre-Anesthesia Assessment:                        - Prior to the procedure, a History and Physical was                         performed, and patient medications and allergies were                         reviewed. The patient is competent. The risks and                         benefits of the procedure and the sedation options and                         risks were discussed with the patient. All questions                         were answered and informed consent was obtained.                         Patient identification and proposed procedure were                         verified by the physician, the nurse, the anesthetist                         and the technician in the endoscopy suite. Mental                         Status Examination: alert and oriented. Airway                         Examination: normal oropharyngeal airway and neck                         mobility. Respiratory Examination: clear to                         auscultation. CV Examination: RRR, no murmurs, no S3  or S4. Prophylactic Antibiotics: The patient does not                         require prophylactic antibiotics. Prior                         Anticoagulants: The patient has taken no previous                          anticoagulant or antiplatelet agents. ASA Grade                         Assessment: II - A patient with mild systemic disease.                         After reviewing the risks and benefits, the patient                         was deemed in satisfactory condition to undergo the                         procedure. The anesthesia plan was to use monitored                         anesthesia care (MAC). Immediately prior to                         administration of medications, the patient was                         re-assessed for adequacy to receive sedatives. The                         heart rate, respiratory rate, oxygen saturations,                         blood pressure, adequacy of pulmonary ventilation, and                         response to care were monitored throughout the                         procedure. The physical status of the patient was                         re-assessed after the procedure.                        After obtaining informed consent, the endoscope was                         passed under direct vision. Throughout the procedure,                         the patient's blood pressure, pulse, and oxygen                         saturations were monitored continuously. The Endoscope  was introduced through the mouth, and advanced to the                         second part of duodenum. The upper GI endoscopy was                         accomplished without difficulty. The patient tolerated                         the procedure well. Findings:      The duodenal bulb, first portion of the duodenum and second portion of       the duodenum were normal. Estimated blood loss: none.      Scattered moderate inflammation characterized by erythema was found in       the entire examined stomach. Biopsies were taken with a cold forceps for       Helicobacter pylori testing. Estimated blood loss was minimal.      A small hiatal hernia was present.  Estimated blood loss: none.      LA Grade A (one or more mucosal breaks less than 5 mm, not extending       between tops of 2 mucosal folds) esophagitis with no bleeding was found.       Biopsies were taken with a cold forceps for histology. Biopsies were       from mid esophagus to rule out EOE. The scope was withdrawn. Dilation       was performed with a Maloney dilator with no resistance at 48 Fr and 52       Fr. The dilation site was examined following endoscope reinsertion and       showed no change. Estimated blood loss was minimal.      Abnormal motility was noted in the esophagus. There is spasticity of the       esophageal body. The distal esophagus/lower esophageal sphincter is       open. Tertiary peristaltic waves are noted. Estimated blood loss: none.      The exam of the esophagus was otherwise normal. Impression:            - Normal duodenal bulb, first portion of the duodenum                         and second portion of the duodenum.                        - Bile gastritis. Biopsied.                        - Small hiatal hernia.                        - LA Grade A reflux esophagitis with no bleeding.                         Biopsied. Dilated.                        - Abnormal esophageal motility, suspicious for                         presbyesophagus. Recommendation:        -  Discharge patient to home.                        - Resume previous diet.                        - Continue present medications.                        - Consider addition of bile acid sequestrant. Avoid if                         continued constipation.                        - Await pathology results.                        - Return to referring physician as previously                         scheduled.                        - The findings and recommendations were discussed with                         the patient. Procedure Code(s):     --- Professional ---                        [phone],  Esophagogastroduodenoscopy, flexible,                         transoral; with biopsy, single or multiple                        [id number], Dilation of esophagus, by unguided sound or                         bougie, single or multiple passes Diagnosis Code(s):     --- Professional ---                        K29.60, Other gastritis without bleeding                        K44.9, Diaphragmatic hernia without obstruction or                         gangrene                        K21.00, Gastro-esophageal reflux disease with                         esophagitis, without bleeding                        K22.4, Dyskinesia of esophagus                        R13.10, Dysphagia, unspecified CPT copyright 2019 American Medical Association. All rights reserved. The codes documented in this report are preliminary and upon coder  review may  be revised to meet current compliance requirements. Attending Participation:      I personally performed the entire procedure. Volney American, DO Annamaria Helling DO, DO 03/28/2021 10:55:00 AM This report has been signed electronically. Number of Addenda: 0 Note Initiated On: 03/28/2021 9:41 AM Estimated Blood Loss:  Estimated blood loss was minimal.      [facility]

## 2021-03-28 NOTE — Anesthesia Postprocedure Evaluation (Signed)
Anesthesia Post Note  Patient: Terri James  Procedure(s) Performed: COLONOSCOPY ESOPHAGOGASTRODUODENOSCOPY (EGD)  Patient location during evaluation: Endoscopy Anesthesia Type: General Level of consciousness: awake and alert Pain management: pain level controlled Vital Signs Assessment: post-procedure vital signs reviewed and stable Respiratory status: spontaneous breathing, nonlabored ventilation, respiratory function stable and patient connected to nasal cannula oxygen Cardiovascular status: blood pressure returned to baseline and stable Postop Assessment: no apparent nausea or vomiting Anesthetic complications: no   No notable events documented.   Last Vitals:  Vitals:   03/28/21 1113 03/28/21 1123  BP: 119/70 124/69  Pulse: 74 71  Resp: 17 14  Temp:    SpO2: 100% 100%    Last Pain:  Vitals:   03/28/21 1123  TempSrc:   PainSc: 0-No pain                 Arita Miss

## 2021-03-28 NOTE — Anesthesia Preprocedure Evaluation (Signed)
Anesthesia Evaluation  Patient identified by MRN, date of birth, ID band Patient awake    Reviewed: Allergy & Precautions, NPO status , Patient's Chart, lab work & pertinent test results  History of Anesthesia Complications Negative for: history of anesthetic complications  Airway Mallampati: II  TM Distance: >3 FB Neck ROM: Full    Dental  (+) Partial Upper, Poor Dentition   Pulmonary neg sleep apnea, COPD, Patient abstained from smoking.Not current smoker, former smoker,  Quit smoking last week   Pulmonary exam normal breath sounds clear to auscultation       Cardiovascular Exercise Tolerance: Good METS(-) hypertension(-) CAD and (-) Past MI negative cardio ROS  (-) dysrhythmias  Rhythm:Regular Rate:Normal - Systolic murmurs    Neuro/Psych PSYCHIATRIC DISORDERS Depression negative neurological ROS     GI/Hepatic hiatal hernia, GERD  Medicated,(+)     (-) substance abuse  ,   Endo/Other  diabetes  Renal/GU negative Renal ROS     Musculoskeletal   Abdominal   Peds  Hematology   Anesthesia Other Findings Past Medical History: No date: Arthritis 2003: Cancer ([place])     Comment:  left breast No date: Chronic low back pain     Comment:  continuous use of opioids No date: COPD (chronic obstructive pulmonary disease) (HCC) No date: Depression No date: Diabetes mellitus without complication (HCC)     Comment:  diet controlled No date: Emphysema lung (HCC) No date: GERD (gastroesophageal reflux disease) No date: History of hiatal hernia No date: HOH (hard of hearing) No date: Hyperlipidemia No date: Left knee pain No date: Osteoporosis 2003: Personal history of chemotherapy     Comment:  Left breast ca 2003: Personal history of radiation therapy     Comment:  left breast ca No date: Pneumonia  Reproductive/Obstetrics                             Anesthesia Physical Anesthesia  Plan  ASA: 2  Anesthesia Plan: General   Post-op Pain Management: Minimal or no pain anticipated   Induction: Intravenous  PONV Risk Score and Plan: 3 and Propofol infusion, TIVA and Ondansetron  Airway Management Planned: Nasal Cannula  Additional Equipment: None  Intra-op Plan:   Post-operative Plan:   Informed Consent: I have reviewed the patients History and Physical, chart, labs and discussed the procedure including the risks, benefits and alternatives for the proposed anesthesia with the patient or authorized representative who has indicated his/her understanding and acceptance.     Dental advisory given  Plan Discussed with: CRNA and Surgeon  Anesthesia Plan Comments: (Discussed risks of anesthesia with patient, including possibility of difficulty with spontaneous ventilation under anesthesia necessitating airway intervention, PONV, and rare risks such as cardiac or respiratory or neurological events, and allergic reactions. Discussed the role of CRNA in patient's perioperative care. Patient understands.)        Anesthesia Quick Evaluation

## 2021-03-28 NOTE — Op Note (Signed)
[facility] Gastroenterology Patient Name: Terri James Procedure Date: 03/28/2021 9:40 AM MRN: [id number] Account #: [id number] Date of Birth: [birth year] Admit Type: Outpatient Age: 71 Room: [facility] ENDO ROOM 2 Gender: Female Note Status: Finalized Instrument Name: Jasper Riling [id number] Procedure:             Colonoscopy Indications:           Colon cancer screening in patient at increased risk:                         Family history of colon polyps in multiple 1st-degree                         relatives Providers:             Rueben Bash, DO Referring MD:          Ocie Cornfield. Ouida Sills MD, MD (Referring MD) Medicines:             Monitored Anesthesia Care Complications:         No immediate complications. Estimated blood loss:                         Minimal. Procedure:             Pre-Anesthesia Assessment:                        - Prior to the procedure, a History and Physical was                         performed, and patient medications and allergies were                         reviewed. The patient is competent. The risks and                         benefits of the procedure and the sedation options and                         risks were discussed with the patient. All questions                         were answered and informed consent was obtained.                         Patient identification and proposed procedure were                         verified by the physician, the nurse, the anesthetist                         and the technician in the endoscopy suite. Mental                         Status Examination: alert and oriented. Airway                         Examination: normal oropharyngeal airway and neck  mobility. Respiratory Examination: clear to                         auscultation. CV Examination: RRR, no murmurs, no S3                         or S4. Prophylactic Antibiotics: The patient does not                          require prophylactic antibiotics. Prior                         Anticoagulants: The patient has taken no previous                         anticoagulant or antiplatelet agents. ASA Grade                         Assessment: II - A patient with mild systemic disease.                         After reviewing the risks and benefits, the patient                         was deemed in satisfactory condition to undergo the                         procedure. The anesthesia plan was to use monitored                         anesthesia care (MAC). Immediately prior to                         administration of medications, the patient was                         re-assessed for adequacy to receive sedatives. The                         heart rate, respiratory rate, oxygen saturations,                         blood pressure, adequacy of pulmonary ventilation, and                         response to care were monitored throughout the                         procedure. The physical status of the patient was                         re-assessed after the procedure.                        After obtaining informed consent, the colonoscope was                         passed under direct vision. Throughout the procedure,  the patient's blood pressure, pulse, and oxygen                         saturations were monitored continuously. The                         Colonoscope was introduced through the anus and                         advanced to the the cecum, identified by appendiceal                         orifice and ileocecal valve. The colonoscopy was                         performed without difficulty. The patient tolerated                         the procedure well. The quality of the bowel                         preparation was evaluated using the BBPS [facility] Bowel                         Preparation Scale) with scores of: Right Colon = 2                         (minor  amount of residual staining, small fragments of                         stool and/or opaque liquid, but mucosa seen well),                         Transverse Colon = 2 (minor amount of residual                         staining, small fragments of stool and/or opaque                         liquid, but mucosa seen well) and Left Colon = 2                         (minor amount of residual staining, small fragments of                         stool and/or opaque liquid, but mucosa seen well). The                         total BBPS score equals 6. The quality of the bowel                         preparation was good. The ileocecal valve, appendiceal                         orifice, and rectum were photographed. Findings:      The perianal and digital rectal examinations were normal. Pertinent       negatives  include normal sphincter tone.      Non-bleeding internal hemorrhoids were found during retroflexion. The       hemorrhoids were Grade I (internal hemorrhoids that do not prolapse).       Estimated blood loss: none.      Six sessile polyps were found in the rectum (1), descending colon (1)       and ascending colon (4). The polyps were 1 to 2 mm in size. These polyps       were removed with a cold biopsy forceps. Resection and retrieval were       complete. Estimated blood loss was minimal.      The exam was otherwise without abnormality on direct and retroflexion       views. Impression:            - Non-bleeding internal hemorrhoids.                        - Six 1 to 2 mm polyps in the rectum, in the                         descending colon and in the ascending colon, removed                         with a cold biopsy forceps. Resected and retrieved.                        - The examination was otherwise normal on direct and                         retroflexion views. Recommendation:        - Discharge patient to home.                        - Resume previous diet.                         - No aspirin, ibuprofen, naproxen, or other                         non-steroidal anti-inflammatory drugs for 5 days after                         polyp removal.                        - Continue present medications.                        - Await pathology results.                        - Repeat colonoscopy for surveillance based on                         pathology results.                        - Return to referring physician as previously                         scheduled.                        -  The findings and recommendations were discussed with                         the patient. Procedure Code(s):     --- Professional ---                        [id number], Colonoscopy, flexible; with biopsy, single or                         multiple Diagnosis Code(s):     --- Professional ---                        Z83.71, Family history of colonic polyps                        K62.1, Rectal polyp                        K63.5, Polyp of colon                        K64.0, First degree hemorrhoids CPT copyright 2019 American [employer] Association. All rights reserved. The codes documented in this report are preliminary and upon coder review may  be revised to meet current compliance requirements. Attending Participation:      I personally performed the entire procedure. Volney American, DO Annamaria Helling DO, DO 03/28/2021 10:59:25 AM This report has been signed electronically. Number of Addenda: 0 Note Initiated On: 03/28/2021 9:40 AM Scope Withdrawal Time: 0 hours 23 minutes 20 seconds  Total Procedure Duration: 0 hours 34 minutes 14 seconds  Estimated Blood Loss:  Estimated blood loss was minimal.      [facility]

## 2021-03-29 ENCOUNTER — Encounter: Payer: Self-pay | Admitting: Gastroenterology

## 2021-03-29 LAB — SURGICAL PATHOLOGY

## 2021-04-04 ENCOUNTER — Other Ambulatory Visit: Payer: Self-pay | Admitting: *Deleted

## 2021-04-04 ENCOUNTER — Other Ambulatory Visit: Payer: Self-pay | Admitting: Internal Medicine

## 2021-04-04 DIAGNOSIS — Z1231 Encounter for screening mammogram for malignant neoplasm of breast: Secondary | ICD-10-CM

## 2021-04-04 DIAGNOSIS — F17210 Nicotine dependence, cigarettes, uncomplicated: Secondary | ICD-10-CM

## 2021-04-04 DIAGNOSIS — Z87891 Personal history of nicotine dependence: Secondary | ICD-10-CM

## 2021-04-13 ENCOUNTER — Ambulatory Visit
Admission: RE | Admit: 2021-04-13 | Discharge: 2021-04-13 | Disposition: A | Discharge status: Home / Self Care | Payer: Medicare Other | Source: Ambulatory Visit | Attending: Acute Care | Admitting: Acute Care

## 2021-04-13 ENCOUNTER — Other Ambulatory Visit: Payer: Self-pay

## 2021-04-13 DIAGNOSIS — F17210 Nicotine dependence, cigarettes, uncomplicated: Secondary | ICD-10-CM | POA: Insufficient documentation

## 2021-04-13 DIAGNOSIS — Z87891 Personal history of nicotine dependence: Secondary | ICD-10-CM | POA: Insufficient documentation

## 2021-04-15 ENCOUNTER — Other Ambulatory Visit: Payer: Self-pay

## 2021-04-15 DIAGNOSIS — Z87891 Personal history of nicotine dependence: Secondary | ICD-10-CM

## 2021-04-15 DIAGNOSIS — F17210 Nicotine dependence, cigarettes, uncomplicated: Secondary | ICD-10-CM

## 2021-04-21 ENCOUNTER — Other Ambulatory Visit: Payer: Self-pay | Admitting: Internal Medicine

## 2021-04-21 DIAGNOSIS — I700 Atherosclerosis of aorta: Secondary | ICD-10-CM

## 2021-04-21 DIAGNOSIS — Z87891 Personal history of nicotine dependence: Secondary | ICD-10-CM

## 2021-04-21 DIAGNOSIS — Z122 Encounter for screening for malignant neoplasm of respiratory organs: Secondary | ICD-10-CM

## 2021-04-21 DIAGNOSIS — F17200 Nicotine dependence, unspecified, uncomplicated: Secondary | ICD-10-CM

## 2021-05-11 ENCOUNTER — Ambulatory Visit
Admission: RE | Admit: 2021-05-11 | Discharge: 2021-05-11 | Disposition: A | Discharge status: Home / Self Care | Payer: Medicare Other | Source: Ambulatory Visit | Attending: Internal Medicine | Admitting: Internal Medicine

## 2021-05-11 DIAGNOSIS — Z1231 Encounter for screening mammogram for malignant neoplasm of breast: Secondary | ICD-10-CM | POA: Insufficient documentation

## 2021-08-21 ENCOUNTER — Encounter: Payer: Self-pay | Admitting: Emergency Medicine

## 2021-08-21 ENCOUNTER — Ambulatory Visit
Admission: EM | Admit: 2021-08-21 | Discharge: 2021-08-21 | Disposition: A | Discharge status: Home / Self Care | Payer: Medicare Other | Attending: Physician Assistant | Admitting: Physician Assistant

## 2021-08-21 DIAGNOSIS — J029 Acute pharyngitis, unspecified: Secondary | ICD-10-CM | POA: Insufficient documentation

## 2021-08-21 DIAGNOSIS — R350 Frequency of micturition: Secondary | ICD-10-CM | POA: Insufficient documentation

## 2021-08-21 LAB — URINALYSIS, ROUTINE W REFLEX MICROSCOPIC
Bilirubin Urine: NEGATIVE
Glucose, UA: NEGATIVE mg/dL
Hgb urine dipstick: NEGATIVE
Ketones, ur: NEGATIVE mg/dL
Leukocytes,Ua: NEGATIVE
Nitrite: NEGATIVE
Protein, ur: NEGATIVE mg/dL
Specific Gravity, Urine: 1.015 (ref 1.005–1.030)
pH: 7 (ref 5.0–8.0)

## 2021-08-21 LAB — GROUP A STREP BY PCR: Group A Strep by PCR: NOT DETECTED

## 2021-08-21 MED ORDER — LIDOCAINE VISCOUS HCL 2 % MT SOLN: 15.0000 mL | OROMUCOSAL | 0 refills | Status: AC | PRN

## 2021-08-21 NOTE — ED Provider Notes (Addendum)
[facility]    CSN: [id number] Arrival date & time: 08/21/21  1259      History   Chief Complaint Chief Complaint  Patient presents with   Sore Throat   Urinary Frequency    HPI Terri James is a 71 y.o. female presenting with multiple complaints.  First she states she has had a sore throat that started yesterday.  Reports it feels irritated and scratchy.  She denies any associated fever, fatigue, cough, congestion, breathing trouble, nausea/vomiting or diarrhea.  Denies any sick contacts.  Has been using cough drops for symptoms.  She does have a history of COPD but is denying any breathing trouble and again no cough.  Patient does states she still able to eat and drink normally.  Patient also reporting urinary frequency and difficulty urinating over the past 1 week.  Denies dysuria, hematuria and no abdominal pain or flank pain.  Reports she had a UTI last month and symptoms feel similar to what they did last month.  Has no other complaints.  HPI  Past Medical History:  Diagnosis Date   Arthritis    Cancer ([place]) 2003   left breast   Chronic low back pain    continuous use of opioids   COPD (chronic obstructive pulmonary disease) (HCC)    Depression    Diabetes mellitus without complication (HCC)    diet controlled   Emphysema lung (HCC)    GERD (gastroesophageal reflux disease)    History of hiatal hernia    HOH (hard of hearing)    Hyperlipidemia    Left knee pain    Osteoporosis    Personal history of chemotherapy 2003   Left breast ca   Personal history of radiation therapy 2003   left breast ca   Pneumonia     Patient Active Problem List   Diagnosis Date Noted   Cholecystitis 05/12/2019   Acute cholecystitis 05/07/2019    Past Surgical History:  Procedure Laterality Date   ABDOMINAL HYSTERECTOMY     BACK SURGERY  2002   fusion with screws, second surgery/   BREAST EXCISIONAL BIOPSY Left 2003   positive   BREAST LUMPECTOMY Left 2003    f/u with chemo and radiation    CATARACT EXTRACTION W/PHACO Right 08/24/2015   Procedure: CATARACT EXTRACTION PHACO AND INTRAOCULAR LENS PLACEMENT ([place]);  Surgeon: Birder Robson, MD;  Location: [facility] ORS;  Service: Ophthalmology;  Laterality: Right;  [place] 1.02 AP% 18.2 CDE 11.28 Fluid Pack lot # [id number]   CATARACT EXTRACTION W/PHACO Left 05/09/2016   Procedure: CATARACT EXTRACTION PHACO AND INTRAOCULAR LENS PLACEMENT ([facility]);  Surgeon: Birder Robson, MD;  Location: [facility] ORS;  Service: Ophthalmology;  Laterality: Left;  [place] 00:49 AP% 17.2 CDE 8.45 Fluid pack lot # [id number]   COLONOSCOPY N/A 03/28/2021   Procedure: COLONOSCOPY;  Surgeon: Annamaria Helling, DO;  Location: [facility] ENDOSCOPY;  Service: Gastroenterology;  Laterality: N/A;  DIET CONTROLLED   COLONOSCOPY WITH PROPOFOL N/A 08/06/2015   Procedure: COLONOSCOPY WITH PROPOFOL;  Surgeon: Manya Silvas, MD;  Location: [facility] ENDOSCOPY;  Service: Endoscopy;  Laterality: N/A;   DILATION AND CURETTAGE OF UTERUS     ESOPHAGOGASTRODUODENOSCOPY N/A 03/28/2021   Procedure: ESOPHAGOGASTRODUODENOSCOPY (EGD);  Surgeon: Annamaria Helling, DO;  Location: [facility] ENDOSCOPY;  Service: Gastroenterology;  Laterality: N/A;   ESOPHAGOGASTRODUODENOSCOPY (EGD) WITH PROPOFOL N/A 08/06/2015   Procedure: ESOPHAGOGASTRODUODENOSCOPY (EGD) WITH PROPOFOL;  Surgeon: Manya Silvas, MD;  Location: [facility] ENDOSCOPY;  Service: Endoscopy;  Laterality: N/A;  ganglion cyst removal Left    POSTERIOR FUSION LUMBAR SPINE     x2   TONSILLECTOMY      OB History   No obstetric history on file.      Home Medications    Prior to Admission medications   Medication Sig Start Date End Date Taking? Authorizing Provider  lidocaine (XYLOCAINE) 2 % solution Use as directed 15 mLs in the mouth or throat every 3 (three) hours as needed for mouth pain (swish and spit). 08/21/21  Yes Danton Clap, PA-C  acetaminophen (TYLENOL) 325 MG tablet Take 650 mg by mouth 2 (two) times  daily as needed for moderate pain or headache.     [provider]  Calcium Carbonate-Vitamin D 600-125 MG-UNIT TABS Take 1 tablet by mouth daily.    [provider]  clonazePAM (KLONOPIN) 0.5 MG tablet Take 0.25 mg by mouth daily as needed for anxiety.     [provider]  cyclobenzaprine (FLEXERIL) 10 MG tablet Take 10 mg by mouth at bedtime.     [provider]  gabapentin (NEURONTIN) 600 MG tablet Take 600 mg by mouth 4 (four) times daily.    [provider]  HYDROcodone-acetaminophen (NORCO) 5-325 MG tablet Take 1 tablet by mouth every 6 (six) hours as needed for up to 6 doses for moderate pain. 06/20/19   Lysle Pearl, Isami, DO  ibuprofen (ADVIL) 800 MG tablet Take 1 tablet (800 mg total) by mouth every 8 (eight) hours as needed for mild pain or moderate pain. 06/20/19   Lysle Pearl, Isami, DO  morphine (MS CONTIN) 15 MG 12 hr tablet Take 15 mg by mouth every 8 (eight) hours.    [provider]  Multiple Vitamin (MULTIVITAMIN WITH MINERALS) TABS tablet Take 1 tablet by mouth daily.    [provider]  pantoprazole (PROTONIX) 40 MG tablet Take 40 mg by mouth daily.    [provider]  simvastatin (ZOCOR) 40 MG tablet Take 40 mg by mouth at bedtime.     [provider]    Family History Family History  Problem Relation Age of Onset   Heart disease Mother    Hypertension Mother    Diabetes Father    Macular degeneration Father    Hypertension Father    Breast cancer Neg Hx     Social History Social History   Tobacco Use   Smoking status: Former    Packs/day: 0.50    Years: 46.00    Total pack years: 23.00    Types: Cigarettes    Quit date: 03/21/2021    Years since quitting: 0.4   Smokeless tobacco: Never  Vaping Use   Vaping Use: Every day  Substance Use Topics   Alcohol use: Yes    Comment: rarely   Drug use: No     Allergies   Patient has no known allergies.   Review of Systems Review of Systems   Constitutional:  Negative for chills, diaphoresis, fatigue and fever.  HENT:  Positive for sore throat. Negative for congestion, ear pain, rhinorrhea and sinus pain.   Respiratory:  Negative for cough and shortness of breath.   Cardiovascular:  Negative for chest pain.  Gastrointestinal:  Negative for abdominal pain, diarrhea, nausea and vomiting.  Genitourinary:  Positive for difficulty urinating and frequency. Negative for decreased urine volume, dysuria, flank pain, hematuria, pelvic pain, urgency, vaginal bleeding, vaginal discharge and vaginal pain.  Musculoskeletal:  Negative for arthralgias, back pain and myalgias.  Skin:  Negative for rash.  Neurological:  Negative for weakness and headaches.  Hematological:  Negative for adenopathy.     Physical Exam Triage Vital Signs ED Triage Vitals  Enc Vitals Group     BP 08/21/21 1337 105/72     Pulse Rate 08/21/21 1337 90     Resp 08/21/21 1337 14     Temp 08/21/21 1337 98.3 F (36.8 C)     Temp Source 08/21/21 1337 Oral     SpO2 08/21/21 1337 98 %     Weight 08/21/21 1334 145 lb (65.8 kg)     Height 08/21/21 1334 '5\' 5"'$  (1.651 m)     Head Circumference --      Peak Flow --      Pain Score 08/21/21 1334 8     Pain Loc --      Pain Edu? --      Excl. in [place]? --    No data found.  Updated Vital Signs BP 105/72 (BP Location: Right Arm)   Pulse 90   Temp 98.3 F (36.8 C) (Oral)   Resp 14   Ht '5\' 5"'$  (1.651 m)   Wt 145 lb (65.8 kg)   SpO2 98%   BMI 24.13 kg/m      Physical Exam Vitals and nursing note reviewed.  Constitutional:      General: She is not in acute distress.    Appearance: Normal appearance. She is not ill-appearing or toxic-appearing.  HENT:     Head: Normocephalic and atraumatic.     Nose: Nose normal.     Mouth/Throat:     Mouth: Mucous membranes are moist.     Pharynx: Oropharynx is clear. Posterior oropharyngeal erythema present.  Eyes:     General: No scleral icterus.       Right eye: No  discharge.        Left eye: No discharge.     Conjunctiva/sclera: Conjunctivae normal.  Cardiovascular:     Rate and Rhythm: Normal rate and regular rhythm.     Heart sounds: Normal heart sounds.  Pulmonary:     Effort: Pulmonary effort is normal. No respiratory distress.     Breath sounds: Normal breath sounds.  Abdominal:     Palpations: Abdomen is soft.     Tenderness: There is no abdominal tenderness. There is no right CVA tenderness or left CVA tenderness.  Musculoskeletal:     Cervical back: Neck supple.  Skin:    General: Skin is dry.  Neurological:     General: No focal deficit present.     Mental Status: She is alert. Mental status is at baseline.     Motor: No weakness.     Gait: Gait normal.  Psychiatric:        Mood and Affect: Mood normal.        Behavior: Behavior normal.        Thought Content: Thought content normal.      UC Treatments / Results  Labs (all labs ordered are listed, but only abnormal results are displayed) Labs Reviewed  GROUP A STREP BY PCR  URINE CULTURE  URINALYSIS, ROUTINE W REFLEX MICROSCOPIC    EKG   Radiology No results found.  Procedures Procedures (including critical care time)  Medications Ordered in UC Medications - No data to display  Initial Impression / Assessment and Plan / UC Course  I have reviewed the triage vital signs and the nursing notes.  Pertinent labs & imaging results that were  available during my care of the patient were reviewed by me and considered in my medical decision making (see chart for details).   1.  Sore throat: Patient reporting onset of sore throat and scratchy/irritated feeling yesterday.  No other associated URI symptoms.  No fevers.  Still able to eat and drink normally and no sick contacts.  PCR strep test performed today is negative.  Likely viral illness versus related to GERD.  Advised increasing rest and fluids.  The viscous lidocaine.  Advised following up if symptoms worsen.  2.   Urinary frequency/difficulty urinating: Patient reporting urinary frequency difficulty urinating for the past 1 week.  Apparently she had a UTI a month ago and was given antibiotics.  Reports symptoms feel the same.  On exam she has no abdominal tenderness and no CVA tenderness.  Urinalysis performed today is completely normal.  We will send urine for culture to ensure eradication of UTI from last month.  Advised patient we will call and send antibiotics if her culture is positive.  Advised her to increase her fluid intake and if she continues to have symptoms follow-up with her primary care provider.   Final Clinical Impressions(s) / UC Diagnoses   Final diagnoses:  Sore throat  Urinary frequency     Discharge Instructions      -Your strep test is negative.  Your sore throat could be related to a viral illness or possibly your acid reflux.  Increase rest and fluid intake.  He may continue with the cough drops.  I have sent viscous lidocaine for you.  You can also try Chloraseptic spray and Tylenol symptoms should improve over the next few days to 1 week.  Develop any worsening symptoms, please return for reevaluation.  -Your urinalysis was normal.  There is no sign of infection however since you had an infection last month I will send the urine for culture and we will call you if there is any bacteria remaining in the urine and send antibiotics for you.  In the meantime make sure you are hydrating well and follow-up with your primary care provider if you continue to experience the symptoms.     ED Prescriptions     Medication Sig Dispense Auth. Provider   lidocaine (XYLOCAINE) 2 % solution Use as directed 15 mLs in the mouth or throat every 3 (three) hours as needed for mouth pain (swish and spit). 100 mL Danton Clap, PA-C      PDMP not reviewed this encounter.   Danton Clap, PA-C 08/21/21 1410    Laurene Footman B, PA-C 08/21/21 1415

## 2021-08-21 NOTE — Discharge Instructions (Addendum)
-  Your strep test is negative.  Your sore throat could be related to a viral illness or possibly your acid reflux.  Increase rest and fluid intake.  He may continue with the cough drops.  I have sent viscous lidocaine for you.  You can also try Chloraseptic spray and Tylenol symptoms should improve over the next few days to 1 week.  Develop any worsening symptoms, please return for reevaluation.  -Your urinalysis was normal.  There is no sign of infection however since you had an infection last month I will send the urine for culture and we will call you if there is any bacteria remaining in the urine and send antibiotics for you.  In the meantime make sure you are hydrating well and follow-up with your primary care provider if you continue to experience the symptoms.

## 2021-08-21 NOTE — ED Triage Notes (Signed)
Patient c/o sore throat that started yesterday.  Patient c/o urinary frequency that started 2 days ago.  Patient denies fevers.

## 2021-08-22 LAB — URINE CULTURE

## 2021-11-24 ENCOUNTER — Ambulatory Visit
Admission: EM | Admit: 2021-11-24 | Discharge: 2021-11-24 | Disposition: A | Discharge status: Home / Self Care | Payer: Medicare Other | Attending: Family Medicine | Admitting: Family Medicine

## 2021-11-24 ENCOUNTER — Other Ambulatory Visit: Payer: Self-pay

## 2021-11-24 DIAGNOSIS — J069 Acute upper respiratory infection, unspecified: Secondary | ICD-10-CM | POA: Diagnosis present

## 2021-11-24 DIAGNOSIS — Z20822 Contact with and (suspected) exposure to covid-19: Secondary | ICD-10-CM | POA: Diagnosis present

## 2021-11-24 LAB — RESP PANEL BY RT-PCR (FLU A&B, COVID) ARPGX2
Influenza A by PCR: NEGATIVE
Influenza B by PCR: NEGATIVE
SARS Coronavirus 2 by RT PCR: NEGATIVE

## 2021-11-24 NOTE — ED Triage Notes (Signed)
Pt reports starting yesterday with nasal congestion, headache and today woke up with a sore throat.

## 2021-11-24 NOTE — Discharge Instructions (Addendum)
Your COVID and influenza tests were negative.    You can take Tylenol and/or Ibuprofen as needed for fever reduction and pain relief.   For congestion: take a daily anti-histamine like Zyrtec, Claritin, and a oral decongestant, such as pseudoephedrine.  You can also use Flonase 1-2 sprays in each nostril daily.     For cough: honey 1/2 to 1 teaspoon (you can dilute the honey in water or another fluid).  You can also use guaifenesin and dextromethorphan for cough. You can use a humidifier for chest congestion and cough.  If you don't have a humidifier, you can sit in the bathroom with the hot shower running.      For sore throat: try warm salt water gargles, cepacol lozenges, throat spray, warm tea or water with lemon/honey, popsicles or ice, or OTC cold relief medicine for throat discomfort.    It is important to stay hydrated: drink plenty of fluids (water, gatorade/powerade/pedialyte, juices, or teas) to keep your throat moisturized and help further relieve irritation/discomfort.    Return or go to the Emergency Department if symptoms worsen or do not improve in the next few days

## 2021-11-24 NOTE — ED Provider Notes (Signed)
[facility]    CSN: [id number] Arrival date & time: 11/24/21  1414      History   Chief Complaint Chief Complaint  Patient presents with   Headache   Nasal Congestion    HPI Terri James is a 71 y.o. female.   HPI   Terri James presents for sore throat with nasal congestion, headache that started yesterday. She feels hot but her temperature was 98.4 F.  Denies rhinorrhea, myalgias, vomiting, diarrhea, chest pain, abdominal pain, shortness of breath, ear pain and rash.  She is eating and drinking well. She has some fatigue and reports going to sleep as soon as her head hit the pillow last night. She took some Tylenol.     Past Medical History:  Diagnosis Date   Arthritis    Cancer ([place]) 2003   left breast   Chronic low back pain    continuous use of opioids   COPD (chronic obstructive pulmonary disease) (HCC)    Depression    Diabetes mellitus without complication (HCC)    diet controlled   Emphysema lung (HCC)    GERD (gastroesophageal reflux disease)    History of hiatal hernia    HOH (hard of hearing)    Hyperlipidemia    Left knee pain    Osteoporosis    Personal history of chemotherapy 2003   Left breast ca   Personal history of radiation therapy 2003   left breast ca   Pneumonia     Patient Active Problem List   Diagnosis Date Noted   Cholecystitis 05/12/2019   Acute cholecystitis 05/07/2019    Past Surgical History:  Procedure Laterality Date   ABDOMINAL HYSTERECTOMY     BACK SURGERY  2002   fusion with screws, second surgery/   BREAST EXCISIONAL BIOPSY Left 2003   positive   BREAST LUMPECTOMY Left 2003   f/u with chemo and radiation    CATARACT EXTRACTION W/PHACO Right 08/24/2015   Procedure: CATARACT EXTRACTION PHACO AND INTRAOCULAR LENS PLACEMENT ([place]);  Surgeon: Birder Robson, MD;  Location: [facility] ORS;  Service: Ophthalmology;  Laterality: Right;  [place] 1.02 AP% 18.2 CDE 11.28 Fluid Pack lot # [id number]   CATARACT EXTRACTION  W/PHACO Left 05/09/2016   Procedure: CATARACT EXTRACTION PHACO AND INTRAOCULAR LENS PLACEMENT ([facility]);  Surgeon: Birder Robson, MD;  Location: [facility] ORS;  Service: Ophthalmology;  Laterality: Left;  [place] 00:49 AP% 17.2 CDE 8.45 Fluid pack lot # [id number]   COLONOSCOPY N/A 03/28/2021   Procedure: COLONOSCOPY;  Surgeon: Annamaria Helling, DO;  Location: [facility] ENDOSCOPY;  Service: Gastroenterology;  Laterality: N/A;  DIET CONTROLLED   COLONOSCOPY WITH PROPOFOL N/A 08/06/2015   Procedure: COLONOSCOPY WITH PROPOFOL;  Surgeon: Manya Silvas, MD;  Location: [facility] ENDOSCOPY;  Service: Endoscopy;  Laterality: N/A;   DILATION AND CURETTAGE OF UTERUS     ESOPHAGOGASTRODUODENOSCOPY N/A 03/28/2021   Procedure: ESOPHAGOGASTRODUODENOSCOPY (EGD);  Surgeon: Annamaria Helling, DO;  Location: [facility] ENDOSCOPY;  Service: Gastroenterology;  Laterality: N/A;   ESOPHAGOGASTRODUODENOSCOPY (EGD) WITH PROPOFOL N/A 08/06/2015   Procedure: ESOPHAGOGASTRODUODENOSCOPY (EGD) WITH PROPOFOL;  Surgeon: Manya Silvas, MD;  Location: [facility] ENDOSCOPY;  Service: Endoscopy;  Laterality: N/A;   ganglion cyst removal Left    POSTERIOR FUSION LUMBAR SPINE     x2   TONSILLECTOMY      OB History   No obstetric history on file.      Home Medications    Prior to Admission medications   Medication Sig Start Date End Date Taking?  Authorizing Provider  Albuterol Sulfate (PROAIR RESPICLICK) 151 (90 Base) MCG/ACT AEPB Inhale into the lungs. 01/24/21 01/24/22 Yes [provider]  naloxone (NARCAN) nasal spray 4 mg/0.1 mL Place into the nose. 09/05/21  Yes [provider]  acetaminophen (TYLENOL) 325 MG tablet Take 650 mg by mouth 2 (two) times daily as needed for moderate pain or headache.     [provider]  Calcium Carbonate-Vitamin D 600-125 MG-UNIT TABS Take 1 tablet by mouth daily.    [provider]  clonazePAM (KLONOPIN) 0.5 MG tablet Take 0.25 mg by mouth daily as needed for anxiety.      [provider]  cyclobenzaprine (FLEXERIL) 10 MG tablet Take 10 mg by mouth at bedtime.     [provider]  gabapentin (NEURONTIN) 600 MG tablet Take 600 mg by mouth 4 (four) times daily.    [provider]  HYDROcodone-acetaminophen (NORCO) 5-325 MG tablet Take 1 tablet by mouth every 6 (six) hours as needed for up to 6 doses for moderate pain. 06/20/19   Lysle Pearl, Isami, DO  ibuprofen (ADVIL) 800 MG tablet Take 1 tablet (800 mg total) by mouth every 8 (eight) hours as needed for mild pain or moderate pain. 06/20/19   Sakai, Isami, DO  lidocaine (XYLOCAINE) 2 % solution Use as directed 15 mLs in the mouth or throat every 3 (three) hours as needed for mouth pain (swish and spit). 08/21/21   Danton Clap, PA-C  morphine (MS CONTIN) 15 MG 12 hr tablet Take 15 mg by mouth every 8 (eight) hours.    [provider]  Multiple Vitamin (MULTIVITAMIN WITH MINERALS) TABS tablet Take 1 tablet by mouth daily.    [provider]  pantoprazole (PROTONIX) 40 MG tablet Take 40 mg by mouth daily.    [provider]  simvastatin (ZOCOR) 40 MG tablet Take 40 mg by mouth at bedtime.     [provider]    Family History Family History  Problem Relation Age of Onset   Heart disease Mother    Hypertension Mother    Diabetes Father    Macular degeneration Father    Hypertension Father    Breast cancer Neg Hx     Social History Social History   Tobacco Use   Smoking status: Former    Packs/day: 0.50    Years: 46.00    Total pack years: 23.00    Types: Cigarettes    Quit date: 03/21/2021    Years since quitting: 0.6   Smokeless tobacco: Never  Vaping Use   Vaping Use: Every day  Substance Use Topics   Alcohol use: Yes    Comment: rarely   Drug use: No     Allergies   Baclofen   Review of Systems Review of Systems: negative unless otherwise stated in HPI.      Physical Exam Triage Vital Signs ED Triage Vitals  Enc Vitals Group      BP 11/24/21 1431 103/74     Pulse Rate 11/24/21 1431 90     Resp 11/24/21 1431 18     Temp 11/24/21 1431 98.2 F (36.8 C)     Temp Source 11/24/21 1431 Oral     SpO2 11/24/21 1431 97 %     Weight 11/24/21 1427 143 lb (64.9 kg)     Height 11/24/21 1427 '5\' 4"'$  (1.626 m)     Head Circumference --      Peak Flow --  Pain Score 11/24/21 1426 8     Pain Loc --      Pain Edu? --      Excl. in [place]? --    No data found.  Updated Vital Signs BP 103/74 (BP Location: Left Arm)   Pulse 90   Temp 98.2 F (36.8 C) (Oral)   Resp 18   Ht '5\' 4"'$  (1.626 m)   Wt 64.9 kg   SpO2 97%   BMI 24.55 kg/m   Visual Acuity Right Eye Distance:   Left Eye Distance:   Bilateral Distance:    Right Eye Near:   Left Eye Near:    Bilateral Near:     Physical Exam GEN:     alert, non-toxic appearing female in no distress     HENT:  mucus membranes moist, oropharyngeal  without lesions or  exudate, no  tonsillar hypertrophy, moderate oropharyngeal erythema,   moderate erythematous edematous turbinates,  clear nasal discharge,  bilateral TM  normal EYES:   pupils equal and reactive, no scleral injection NECK:  normal ROM, no lymphadenopathy RESP:  no increased work of breathing,  clear to auscultation bilaterally CVS:   regular rate  and rhythm Skin:   warm and dry, no rash on visible skin    UC Treatments / Results  Labs (all labs ordered are listed, but only abnormal results are displayed) Labs Reviewed  RESP PANEL BY RT-PCR (FLU A&B, COVID) [id number]    EKG   Radiology No results found.  Procedures Procedures (including critical care time)  Medications Ordered in UC Medications - No data to display  Initial Impression / Assessment and Plan / UC Course  I have reviewed the triage vital signs and the nursing notes.  Pertinent labs & imaging results that were available during my care of the patient were reviewed by me and considered in my medical decision making (see chart for  details).       Pt is a 71 y.o. female who presents for 2 days of respiratory symptoms. Juanita is  afebrile here without recent antipyretics. Satting well on room air. Overall pt is  well appearing, well hydrated, without respiratory distress. Pulmonary exam  is unremarkable.  COVID  and influenza testing is negative. History consistent with  viral respiratory illness. Discussed symptomatic treatment.  Explained lack of efficacy of antibiotics in viral disease.  Typical duration of symptoms discussed.  Return and ED precautions given and patient voiced understanding. - continue age-appropriate Tylenol/  Motrin as needed for discomfort/fever - nasal saline to help with his nasal congestion - Use a mist humidifier to help with breathing - Stressed importance of hydration - Discussed return and ED precautions, understanding voiced.   Discussed MDM, treatment plan and plan for follow-up with patient who agrees with plan.     Final Clinical Impressions(s) / UC Diagnoses   Final diagnoses:  Viral upper respiratory tract infection  Encounter for laboratory testing for COVID-19 virus     Discharge Instructions      Your COVID and influenza tests were negative.    You can take Tylenol and/or Ibuprofen as needed for fever reduction and pain relief.   For congestion: take a daily anti-histamine like Zyrtec, Claritin, and a oral decongestant, such as pseudoephedrine.  You can also use Flonase 1-2 sprays in each nostril daily.     For cough: honey 1/2 to 1 teaspoon (you can dilute the honey in water or another fluid).  You can also use guaifenesin and  dextromethorphan for cough. You can use a humidifier for chest congestion and cough.  If you don't have a humidifier, you can sit in the bathroom with the hot shower running.      For sore throat: try warm salt water gargles, cepacol lozenges, throat spray, warm tea or water with lemon/honey, popsicles or ice, or OTC cold relief medicine for  throat discomfort.    It is important to stay hydrated: drink plenty of fluids (water, gatorade/powerade/pedialyte, juices, or teas) to keep your throat moisturized and help further relieve irritation/discomfort.    Return or go to the Emergency Department if symptoms worsen or do not improve in the next few days      ED Prescriptions   None    PDMP not reviewed this encounter.   Lyndee Hensen, DO 11/24/21 1533

## 2021-12-23 IMAGING — CT CT IMAGE GUIDED DRAINAGE BY PERCUTANEOUS CATHETER
1 of 3 series · 13 of 32 positions shown, 19 images · non-contrast
Comparison: none

INDICATION: Cholecystitis.
TECHNIQUE: After discussing the risks and benefits of this procedure patient
informed consent was obtained.

[Series 2: i-spiral 5.0 b30f · axial · 0.73mm/px · z∈[-202,-90]mm · 13 of 38 slices shown, 19 images]
[im 3/38  soft-tissue]
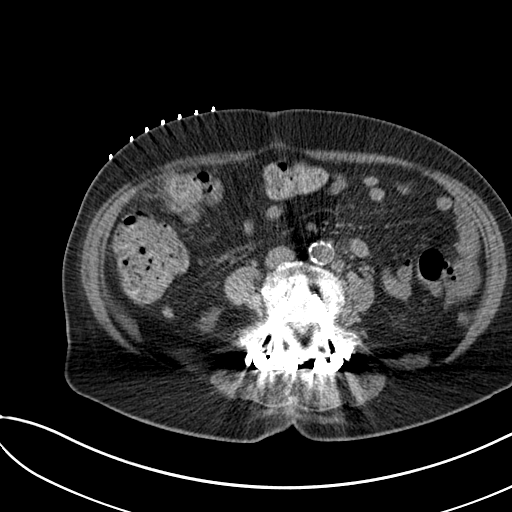
[im 3/38  bone]
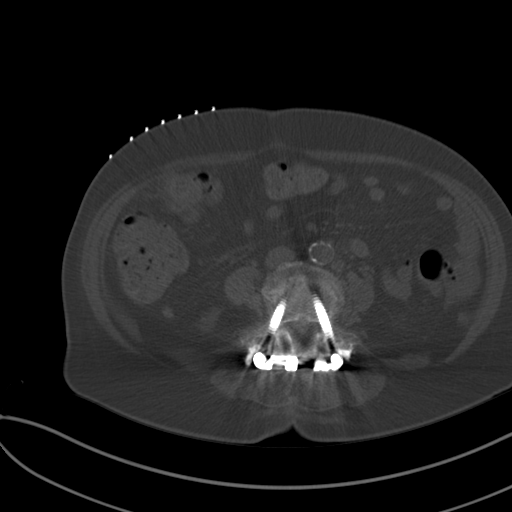
[im 5/38  soft-tissue]
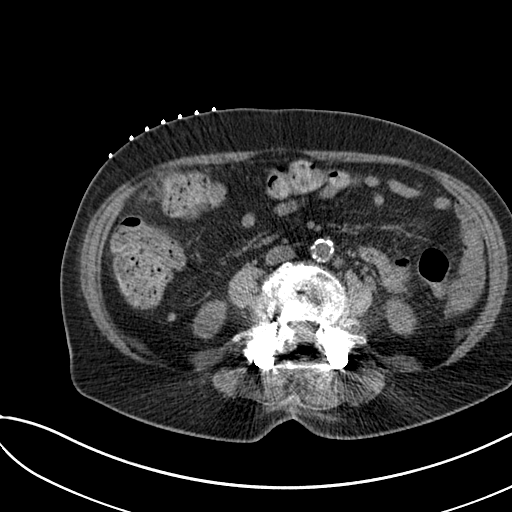
[im 8/38  soft-tissue]
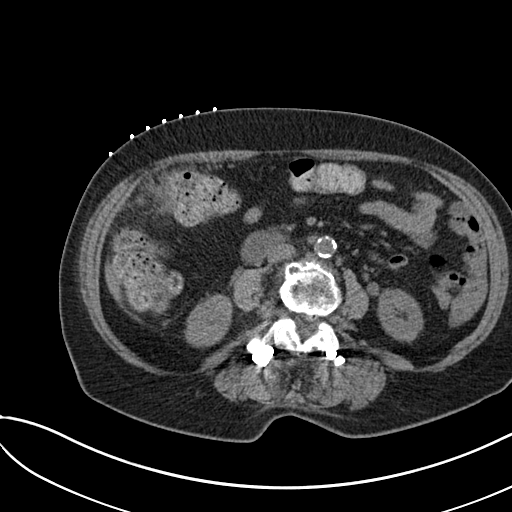
[im 10/38  soft-tissue]
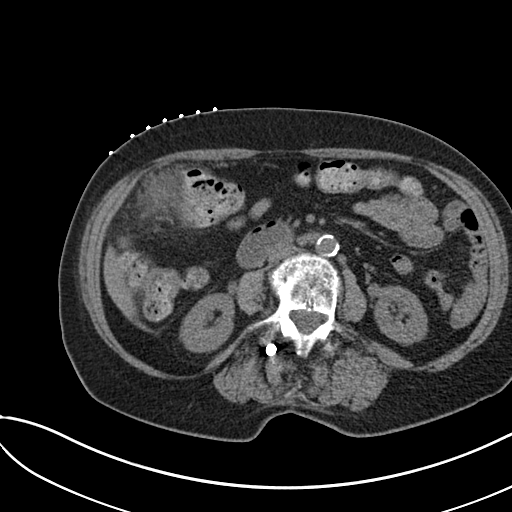
[im 13/38  soft-tissue]
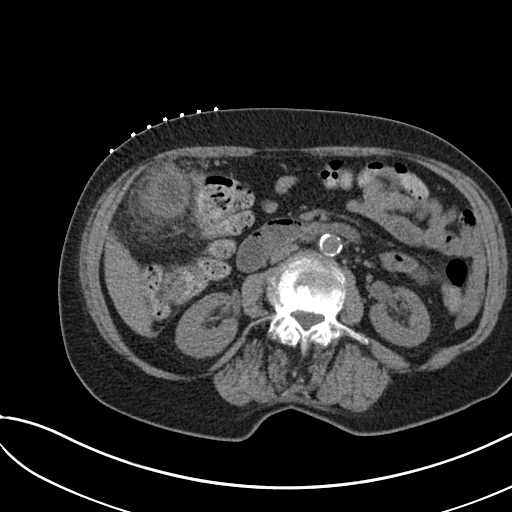
[im 15/38  soft-tissue]
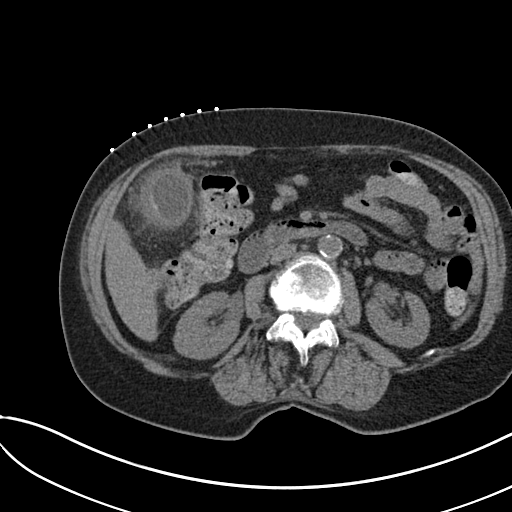
[im 20/38  soft-tissue]
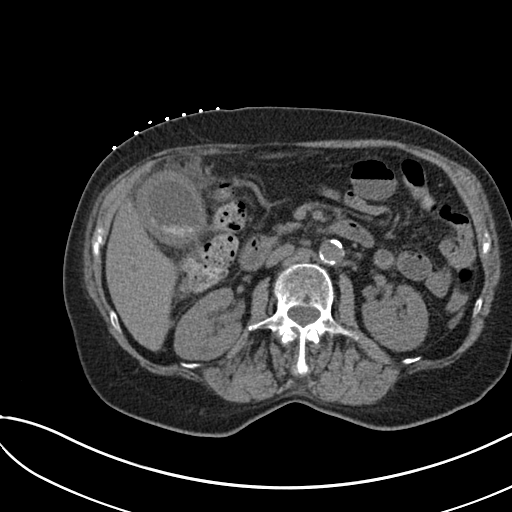
[im 23/38  soft-tissue]
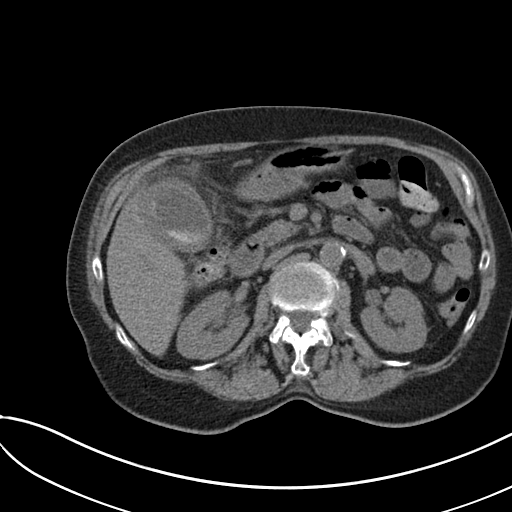
[im 25/38  soft-tissue]
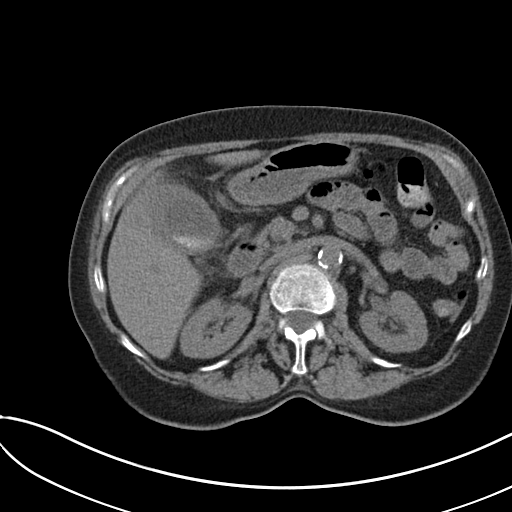
[im 25/38  bone]
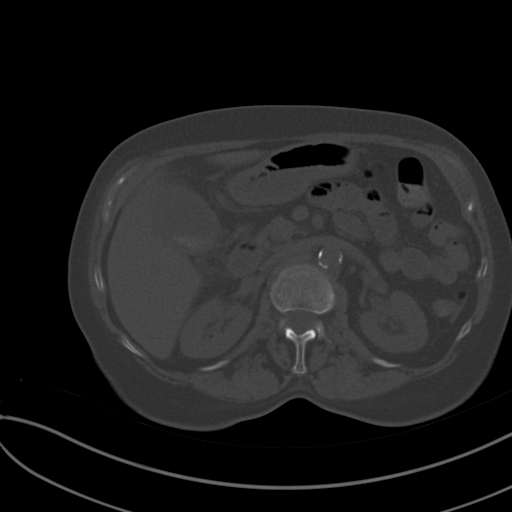
[im 28/38  soft-tissue]
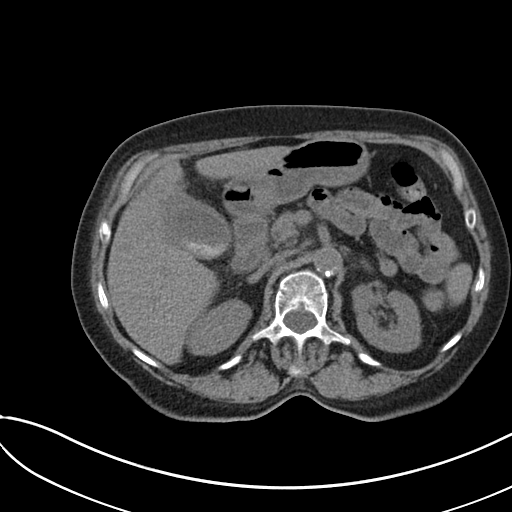
[im 28/38  lung]
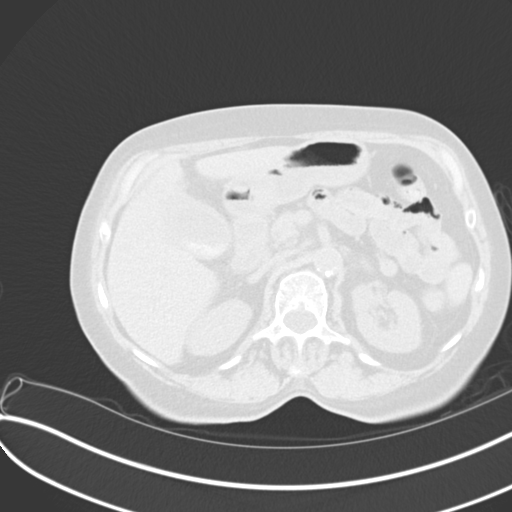
[im 30/38  soft-tissue]
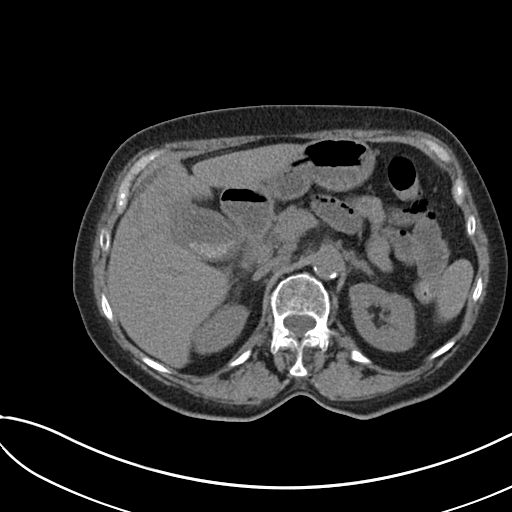
[im 30/38  lung]
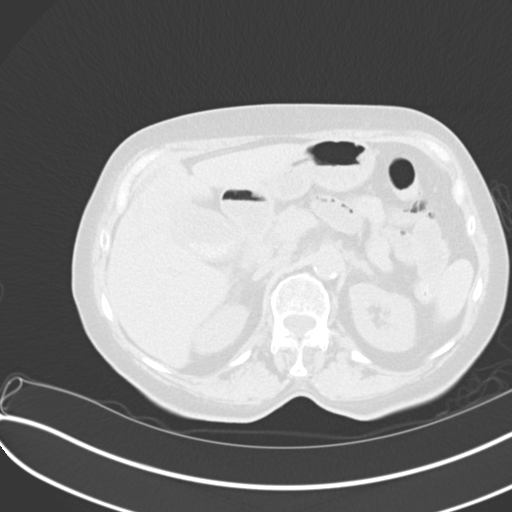
[im 33/38  soft-tissue]
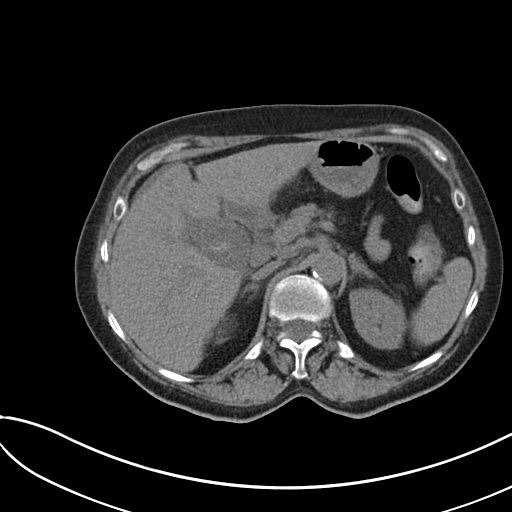
[im 33/38  lung]
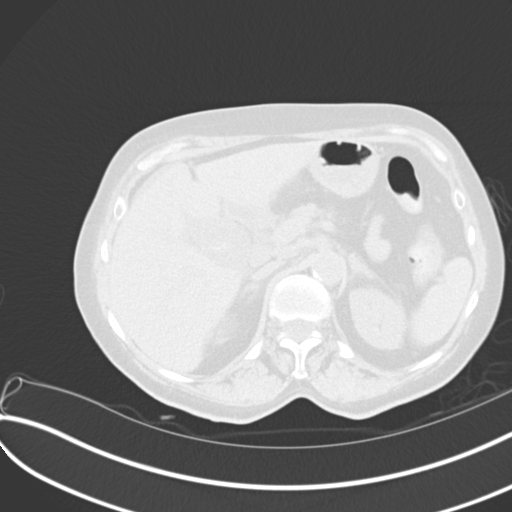
[im 35/38  soft-tissue]
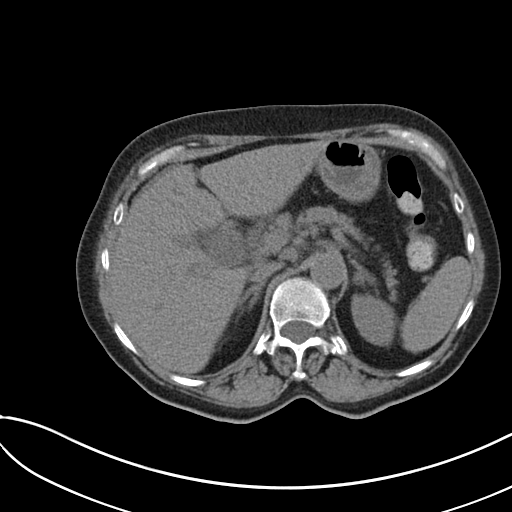
[im 35/38  lung]
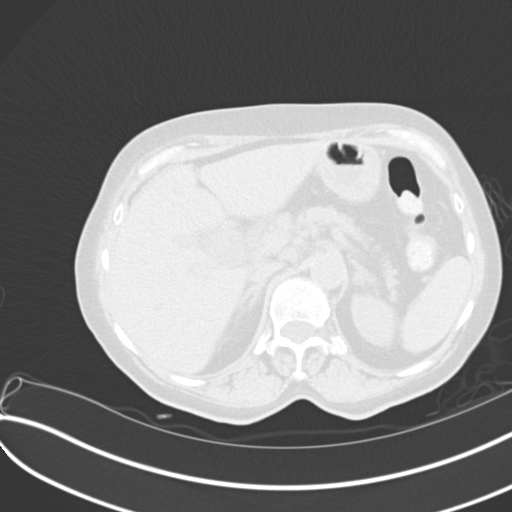

[13 of 32 positions shown; findings below may reference images not displayed]

EXAM:
CT GUIDED CHOLECYSTOSTOMY.

MEDICATIONS:
The patient is currently admitted to the hospital and receiving
intravenous antibiotics. The antibiotics were administered within an
appropriate time frame prior to the initiation of the procedure.

ANESTHESIA/SEDATION:
2.0 mg IV Versed 50 mcg IV Fentanyl

Moderate Sedation Time: 20 min

The patient was continuously monitored during the procedure by the
interventional radiology nurse under my direct supervision.

COMPLICATIONS:
None immediate.
PROCEDURE:
The patient was sterilely prepped and draped. Local anesthesia was
administered with 1% lidocaine. Under CT guidance an 18 gauge needle
was advanced into the gallbladder. Bile was obtained. An Amplatz
extra stiff wire was placed. This was followed by placement of an
8.5 French pigtail catheter. The gallbladder was drained. The
catheter was sutured into place and placed to bag drainage. There
were no complications. Patient left the CT suite in stable
condition.
FINDINGS: Cholecystitis.  Successful CT-directed percutaneous cystostomy.
IMPRESSION: Successful CT-directed percutaneous cholecystostomy.

## 2022-01-17 ENCOUNTER — Other Ambulatory Visit: Payer: Self-pay

## 2022-01-17 ENCOUNTER — Ambulatory Visit
Admission: EM | Admit: 2022-01-17 | Discharge: 2022-01-17 | Disposition: A | Discharge status: Home / Self Care | Payer: Medicare Other

## 2022-01-17 ENCOUNTER — Encounter: Payer: Self-pay | Admitting: Emergency Medicine

## 2022-01-17 ENCOUNTER — Emergency Department: Payer: Medicare Other

## 2022-01-17 ENCOUNTER — Emergency Department
Admission: EM | Admit: 2022-01-17 | Discharge: 2022-01-17 | Disposition: A | Discharge status: Home / Self Care | Payer: Medicare Other | Attending: Emergency Medicine | Admitting: Emergency Medicine

## 2022-01-17 DIAGNOSIS — M545 Low back pain, unspecified: Secondary | ICD-10-CM | POA: Insufficient documentation

## 2022-01-17 DIAGNOSIS — M5442 Lumbago with sciatica, left side: Secondary | ICD-10-CM | POA: Diagnosis not present

## 2022-01-17 DIAGNOSIS — G8929 Other chronic pain: Secondary | ICD-10-CM | POA: Diagnosis not present

## 2022-01-17 DIAGNOSIS — M5441 Lumbago with sciatica, right side: Secondary | ICD-10-CM | POA: Diagnosis not present

## 2022-01-17 MED ORDER — LIDOCAINE 5 % EX PTCH
1.0000 | MEDICATED_PATCH | CUTANEOUS | Status: DC
  Administered 2022-01-17: 1 via TRANSDERMAL
  Filled 2022-01-17: qty 1

## 2022-01-17 MED ORDER — LIDOCAINE 5 % EX PTCH: 1.0000 | MEDICATED_PATCH | Freq: Two times a day (BID) | CUTANEOUS | 0 refills | Status: AC

## 2022-01-17 MED ORDER — MORPHINE SULFATE (PF) 4 MG/ML IV SOLN
5.0000 mg | Freq: Once | INTRAVENOUS | Status: AC
  Administered 2022-01-17: 5 mg via INTRAMUSCULAR
  Filled 2022-01-17: qty 2

## 2022-01-17 MED ORDER — ACETAMINOPHEN 325 MG PO TABS
650.0000 mg | ORAL_TABLET | Freq: Once | ORAL | Status: AC
  Administered 2022-01-17: 650 mg via ORAL
  Filled 2022-01-17: qty 2

## 2022-01-17 MED ORDER — KETOROLAC TROMETHAMINE 15 MG/ML IJ SOLN
15.0000 mg | Freq: Once | INTRAMUSCULAR | Status: AC
  Administered 2022-01-17: 15 mg via INTRAMUSCULAR
  Filled 2022-01-17: qty 1

## 2022-01-17 NOTE — ED Provider Notes (Addendum)
[facility]    CSN: [id number] Arrival date & time: 01/17/22  9702      History   Chief Complaint Chief Complaint  Patient presents with   Lower Body Pain    HPI Terri James is a 71 y.o. female presenting for onset of severe lower back pain with radiation to bilateral lower extremities which she describes as sharp and burning pains that are constant since last night.  No injury.  She reports that it feels like she has "bone pain."  She says pain radiates to her anterior thighs and elevate down to both of her ankles.  She denies any numbness, weakness or tingling.  She does have a history of chronic back pain but says she is never felt pain like this.  She reports that she takes morphine multiple times a day and it has not helped her back pain at all.  She states that it is 10 out of 10 pain presently.  She denies any recent illnesses.  Reports diarrhea and vomiting 10 days ago that lasted for a day.  Reports she was exposed to a stomach virus.  Other than that, she has not been sick.  No fever, fatigue, URI symptoms, abdominal pain, urinary symptoms.  History of breast cancer.  Reports history of radiation then chemo.  Denies any pain related to cancer/radiation in the past.  Patient has not reached out to her pain clinic about her increased pain.  Her medical history is also significant for COPD, depression, diabetes, hyperlipidemia, osteoporosis.  HPI  Past Medical History:  Diagnosis Date   Arthritis    Cancer ([place]) 2003   left breast   Chronic low back pain    continuous use of opioids   COPD (chronic obstructive pulmonary disease) (HCC)    Depression    Diabetes mellitus without complication (HCC)    diet controlled   Emphysema lung (HCC)    GERD (gastroesophageal reflux disease)    History of hiatal hernia    HOH (hard of hearing)    Hyperlipidemia    Left knee pain    Osteoporosis    Personal history of chemotherapy 2003   Left breast ca   Personal  history of radiation therapy 2003   left breast ca   Pneumonia     Patient Active Problem List   Diagnosis Date Noted   Cholecystitis 05/12/2019   Acute cholecystitis 05/07/2019    Past Surgical History:  Procedure Laterality Date   ABDOMINAL HYSTERECTOMY     BACK SURGERY  2002   fusion with screws, second surgery/   BREAST EXCISIONAL BIOPSY Left 2003   positive   BREAST LUMPECTOMY Left 2003   f/u with chemo and radiation    CATARACT EXTRACTION W/PHACO Right 08/24/2015   Procedure: CATARACT EXTRACTION PHACO AND INTRAOCULAR LENS PLACEMENT ([place]);  Surgeon: Birder Robson, MD;  Location: [facility] ORS;  Service: Ophthalmology;  Laterality: Right;  [place] 1.02 AP% 18.2 CDE 11.28 Fluid Pack lot # [id number]   CATARACT EXTRACTION W/PHACO Left 05/09/2016   Procedure: CATARACT EXTRACTION PHACO AND INTRAOCULAR LENS PLACEMENT (I[facility]);  Surgeon: Birder Robson, MD;  Location: [facility] ORS;  Service: Ophthalmology;  Laterality: Left;  [place] 00:49 AP% 17.2 CDE 8.45 Fluid pack lot # [id number]   COLONOSCOPY N/A 03/28/2021   Procedure: COLONOSCOPY;  Surgeon: Annamaria Helling, DO;  Location: [facility] ENDOSCOPY;  Service: Gastroenterology;  Laterality: N/A;  DIET CONTROLLED   COLONOSCOPY WITH PROPOFOL N/A 08/06/2015   Procedure: COLONOSCOPY WITH PROPOFOL;  Surgeon: Manya Silvas, MD;  Location: [facility] ENDOSCOPY;  Service: Endoscopy;  Laterality: N/A;   DILATION AND CURETTAGE OF UTERUS     ESOPHAGOGASTRODUODENOSCOPY N/A 03/28/2021   Procedure: ESOPHAGOGASTRODUODENOSCOPY (EGD);  Surgeon: Annamaria Helling, DO;  Location: [facility] ENDOSCOPY;  Service: Gastroenterology;  Laterality: N/A;   ESOPHAGOGASTRODUODENOSCOPY (EGD) WITH PROPOFOL N/A 08/06/2015   Procedure: ESOPHAGOGASTRODUODENOSCOPY (EGD) WITH PROPOFOL;  Surgeon: Manya Silvas, MD;  Location: [facility] END[facility]COPY;  Service: Endoscopy;  Laterality: N/A;   ganglion cyst removal Left    POSTERIOR FUSION LUMBAR SPINE     x2   TONSILLECTOMY      OB History   No  obstetric history on file.      Home Medications    Prior to Admission medications   Medication Sig Start Date End Date Taking? Authorizing Provider  acetaminophen (TYLENOL) 325 MG tablet Take 650 mg by mouth 2 (two) times daily as needed for moderate pain or headache.    Yes [provider]  Albuterol Sulfate (PROAIR RESPICLICK) 967 (90 Base) MCG/ACT AEPB Inhale into the lungs. 01/24/21 01/24/22 Yes [provider]  Calcium Carbonate-Vitamin D 600-125 MG-UNIT TABS Take 1 tablet by mouth daily.   Yes [provider]  clonazePAM (KLONOPIN) 0.5 MG tablet Take 0.25 mg by mouth daily as needed for anxiety.    Yes [provider]  cyclobenzaprine (FLEXERIL) 10 MG tablet Take 10 mg by mouth at bedtime.   Yes [provider]  gabapentin (NEURONTIN) 600 MG tablet Take 600 mg by mouth 4 (four) times daily.   Yes [provider]  HYDROcodone-acetaminophen (NORCO) 5-325 MG tablet Take 1 tablet by mouth every 6 (six) hours as needed for up to 6 doses for moderate pain. 06/20/19  Yes Sakai, Isami, DO  ibuprofen (ADVIL) 800 MG tablet Take 1 tablet (800 mg total) by mouth every 8 (eight) hours as needed for mild pain or moderate pain. 06/20/19  Yes Sakai, Isami, DO  morphine (MS CONTIN) 15 MG 12 hr tablet Take 15 mg by mouth every 8 (eight) hours.   Yes [provider]  Multiple Vitamin (MULTIVITAMIN WITH MINERALS) TABS tablet Take 1 tablet by mouth daily.   Yes [provider]  naloxone (NARCAN) nasal spray 4 mg/0.1 mL Place into the nose. 09/05/21  Yes [provider]  pantoprazole (PROTONIX) 40 MG tablet Take 40 mg by mouth daily.   Yes [provider]  simvastatin (ZOCOR) 40 MG tablet Take 40 mg by mouth at bedtime.    Yes [provider]  lidocaine (XYLOCAINE) 2 % solution Use as directed 15 mLs in the mouth or throat every 3 (three) hours as needed for mouth pain (swish and spit). 08/21/21   Danton Clap, PA-C     Family History Family History  Problem Relation Age of Onset   Heart disease Mother    Hypertension Mother    Diabetes Father    Macular degeneration Father    Hypertension Father    Breast cancer Neg Hx     Social History Social History   Tobacco Use   Smoking status: Former    Packs/day: 0.50    Years: 46.00    Total pack years: 23.00    Types: Cigarettes    Quit date: 03/21/2021    Years since quitting: 0.8   Smokeless tobacco: Never  Vaping Use   Vaping Use: Every day  Substance Use Topics   Alcohol use: Yes    Comment: rarely  Drug use: No     Allergies   Baclofen   Review of Systems Review of Systems  Constitutional:  Negative for fatigue and fever.  HENT:  Negative for congestion.   Respiratory:  Negative for cough and shortness of breath.   Cardiovascular:  Negative for chest pain.  Gastrointestinal:  Negative for abdominal pain, diarrhea, nausea and vomiting.  Genitourinary:  Negative for difficulty urinating, dysuria and flank pain.  Musculoskeletal:  Positive for arthralgias, back pain and myalgias. Negative for gait problem and joint swelling.  Skin:  Negative for rash and wound.  Neurological:  Negative for weakness and numbness.     Physical Exam Triage Vital Signs ED Triage Vitals  Enc Vitals Group     BP      Pulse      Resp      Temp      Temp src      SpO2      Weight      Height      Head Circumference      Peak Flow      Pain Score      Pain Loc      Pain Edu?      Excl. in [place]?    No data found.  Updated Vital Signs BP 121/65 (BP Location: Left Arm)   Pulse 88   Temp 98.3 F (36.8 C) (Oral)   Resp 18   Ht '5\' 6"'$  (1.676 m)   Wt 145 lb (65.8 kg)   SpO2 96%   BMI 23.40 kg/m   Physical Exam Vitals and nursing note reviewed.  Constitutional:      General: She is not in acute distress.    Appearance: Normal appearance. She is not ill-appearing or toxic-appearing.  HENT:     Head: Normocephalic and atraumatic.      Nose: Nose normal.     Mouth/Throat:     Mouth: Mucous membranes are moist.     Pharynx: Oropharynx is clear.  Eyes:     General: No scleral icterus.       Right eye: No discharge.        Left eye: No discharge.     Conjunctiva/sclera: Conjunctivae normal.  Cardiovascular:     Rate and Rhythm: Normal rate and regular rhythm.     Heart sounds: Normal heart sounds.  Pulmonary:     Effort: Pulmonary effort is normal. No respiratory distress.     Breath sounds: Normal breath sounds.  Abdominal:     Palpations: Abdomen is soft.     Tenderness: There is no abdominal tenderness.  Musculoskeletal:     Cervical back: Neck supple.  Skin:    General: Skin is dry.  Neurological:     General: No focal deficit present.     Mental Status: She is alert. Mental status is at baseline.     Motor: No weakness.     Gait: Gait normal.     Comments: No tenderness of back or lower extremities to palpation. Negative SLR bilaterally. 5/5 strength bilateral lower extremities  Psychiatric:        Mood and Affect: Mood normal.        Behavior: Behavior normal.        Thought Content: Thought content normal.      UC Treatments / Results  Labs (all labs ordered are listed, but only abnormal results are displayed) Labs Reviewed - No data to display  EKG   Radiology No results  found.  Procedures Procedures (including critical care time)  Medications Ordered in UC Medications - No data to display  Initial Impression / Assessment and Plan / UC Course  I have reviewed the triage vital signs and the nursing notes.  Pertinent labs & imaging results that were available during my care of the patient were reviewed by me and considered in my medical decision making (see chart for details).   71 year old female with history of chronic pain presents for severe acute worsening of lower back pain with radiation to lower extremities since yesterday.  No response to morphine that she takes chronically  multiple times a day.  No injuries or falls.  No numbness, tingling or weakness.   VSS and patient is overall well appearing.  She has no tenderness palpation of any aspect of her back or lower extremities.  Negative straight leg raise bilaterally.  5 out of 5 strength of lower extremities.  Chest clear auscultation heart regular rate and rhythm.  No abdominal tenderness.  Given that patient is reporting 10 out of 10 back pain despite taking morphine multiple times a day and the fact that she has a history of cancer, advised immediate follow-up in the emergency department at this time for further evaluation and pain management.  She says she may contact her pain management clinic first.  Advised that they cannot see her today that she should go to the ER.  She is agreeable.   Final Clinical Impressions(s) / UC Diagnoses   Final diagnoses:  Bilateral low back pain with bilateral sciatica, unspecified chronicity     Discharge Instructions      -You should go to the emergency department right away for evaluation of your severe back pain.  Since you are already on morphine there is really nothing else stronger than I can give you an urgent care that will be helpful and you need further workup to determine the cause.  You may try reaching out to the pain clinic to see if they can see you right away but if they cannot you should definitely go to ER.  You have been advised to follow up immediately in the emergency department for concerning signs.symptoms. If you declined EMS transport, please have a family member take you directly to the ED at this time. Do not delay. Based on concerns about condition, if you do not follow up in th e ED, you may risk poor outcomes including worsening of condition, delayed treatment and potentially life threatening issues. If you have declined to go to the ED at this time, you should call your PCP immediately to set up a follow up appointment.  Go to ED for red flag  symptoms, including; fevers you cannot reduce with Tylenol/Motrin, severe headaches, vision changes, numbness/weakness in part of the body, lethargy, confusion, intractable vomiting, severe dehydration, chest pain, breathing difficulty, severe persistent abdominal or pelvic pain, signs of severe infection (increased redness, swelling of an area), feeling faint or passing out, dizziness, etc. You should especially go to the ED for sudden acute worsening of condition if you do not elect to go at this time.      ED Prescriptions   None    I have reviewed the PDMP during this encounter.   Danton Clap, PA-C 01/17/22 1021    Laurene Footman B, PA-C 01/17/22 1022

## 2022-01-17 NOTE — ED Notes (Signed)
See triage note  Presents from UC with lower back pain  States pain started last pm while sitting in chair  Pain radiated into both legs    States she was not able to sleep last pm

## 2022-01-17 NOTE — Discharge Instructions (Addendum)
-  You should go to the emergency department right away for evaluation of your severe back pain.  Since you are already on morphine there is really nothing else stronger than I can give you an urgent care that will be helpful and you need further workup to determine the cause.  You may try reaching out to the pain clinic to see if they can see you right away but if they cannot you should definitely go to ER.  You have been advised to follow up immediately in the emergency department for concerning signs.symptoms. If you declined EMS transport, please have a family member take you directly to the ED at this time. Do not delay. Based on concerns about condition, if you do not follow up in th e ED, you may risk poor outcomes including worsening of condition, delayed treatment and potentially life threatening issues. If you have declined to go to the ED at this time, you should call your PCP immediately to set up a follow up appointment.  Go to ED for red flag symptoms, including; fevers you cannot reduce with Tylenol/Motrin, severe headaches, vision changes, numbness/weakness in part of the body, lethargy, confusion, intractable vomiting, severe dehydration, chest pain, breathing difficulty, severe persistent abdominal or pelvic pain, signs of severe infection (increased redness, swelling of an area), feeling faint or passing out, dizziness, etc. You should especially go to the ED for sudden acute worsening of condition if you do not elect to go at this time.

## 2022-01-17 NOTE — ED Triage Notes (Signed)
Patient arrives ambulatory by POV with daughter c/o pain "waist down on both sides." Pain started yesterday. Went to UC but was sent here for further evaluation due to being on chronic pain meds. Patient reports taking her home '15mg'$  morphine approx 7:30 am. Denies any new injury. No issues with bowel or bladder.

## 2022-01-17 NOTE — Discharge Instructions (Addendum)
Your CT scan demonstrates degenerative changes without fracture.  You are given medicines to help with your symptoms in the emergency department.  Please follow-up with your pain management provider.  You may use the Lidoderm patches as prescribed.  Please return to the emergency department for any new, worsening, or changing symptoms or other concerns including weakness in your legs, urinary or stool incontinence or retention, numbness or tingling in your extremities/buttocks/groin, fevers, or any other concerns or change in symptoms.

## 2022-01-17 NOTE — ED Provider Triage Note (Signed)
Emergency Medicine Provider Triage Evaluation Note  SARIYA TRICKEY , a 71 y.o. female  was evaluated in triage.  Pt complains of increasing chronic/low back pain.  New symptoms of radiation to the legs, sent by urgent care.  Patient takes 15 mg morphine daily.  Review of Systems  Positive:  Negative:   Physical Exam  BP 103/66   Pulse 95   Temp 98.5 F (36.9 C) (Oral)   Resp 18   Ht '5\' 6"'$  (1.676 m)   Wt 65.8 kg   SpO2 90%   BMI 23.40 kg/m  Gen:   Awake, no distress   Resp:  Normal effort  MSK:   Moves extremities without difficulty, lumbar spine tender to palpation Other:    Medical Decision Making  Medically screening exam initiated at 12:32 PM.  Appropriate orders placed.  Hamilton Capri was informed that the remainder of the evaluation will be completed by another provider, this initial triage assessment does not replace that evaluation, and the importance of remaining in the ED until their evaluation is complete.     Versie Starks, PA-C 01/17/22 1232

## 2022-01-17 NOTE — ED Notes (Signed)
Bil lower back pain onset last night. Pain worse with movement.

## 2022-01-17 NOTE — ED Provider Notes (Signed)
[facility] Provider Note    Event Date/Time   First MD Initiated Contact with Patient 01/17/22 1327     (approximate)   History   Back Pain   HPI  Terri James is a 71 y.o. female with a past medical history of chronic back pain and pain contract with pain management clinic who presents today for evaluation of acute on chronic back pain.  Patient reports that her pain is in the same location as usual which is across her lower back.  It worsened last night.  She denies any specific injury.  She reports that she has been taking her morphine as prescribed, but is not getting relief of her pain.  She denies any radiation of her pain.  She denies any weakness or paresthesias in her legs.  No urinary or fecal incontinence or retention.  No fevers or chills.  She reports that she is still able to ambulate.  Patient Active Problem List   Diagnosis Date Noted   Cholecystitis 05/12/2019   Acute cholecystitis 05/07/2019          Physical Exam   Triage Vital Signs: ED Triage Vitals  Enc Vitals Group     BP 01/17/22 1221 103/66     Pulse Rate 01/17/22 1221 95     Resp 01/17/22 1221 18     Temp 01/17/22 1221 98.5 F (36.9 C)     Temp Source 01/17/22 1221 Oral     SpO2 01/17/22 1221 90 %     Weight 01/17/22 1224 145 lb (65.8 kg)     Height 01/17/22 1224 '5\' 6"'$  (1.676 m)     Head Circumference --      Peak Flow --      Pain Score 01/17/22 1223 10     Pain Loc --      Pain Edu? --      Excl. in [place]? --     Most recent vital signs: Vitals:   01/17/22 1221  BP: 103/66  Pulse: 95  Resp: 18  Temp: 98.5 F (36.9 C)  SpO2: 90%    Physical Exam Vitals and nursing note reviewed.  Constitutional:      General: Awake and alert. No acute distress.    Appearance: Normal appearance. The patient is normal weight.  HENT:     Head: Normocephalic and atraumatic.     Mouth: Mucous membranes are moist.  Eyes:     General: PERRL. Normal EOMs         Right eye: No discharge.        Left eye: No discharge.     Conjunctiva/sclera: Conjunctivae normal.  Cardiovascular:     Rate and Rhythm: Normal rate and regular rhythm.     Pulses: Normal pulses.  Pulmonary:     Effort: Pulmonary effort is normal. No respiratory distress.     Breath sounds: Normal breath sounds.  Abdominal:     Abdomen is soft. There is no abdominal tenderness. No rebound or guarding. No distention. Musculoskeletal:        General: No swelling. Normal range of motion.     Cervical back: Normal range of motion and neck supple. Back: No midline tenderness.  Tenderness to bilateral lumbar paraspinal muscles.  No rash or skin changes noted.  Strength and sensation 5/5 to bilateral lower extremities. Normal great toe extension against resistance. Normal sensation throughout feet. Normal patellar reflexes. Negative SLR and opposite SLR bilaterally. Negative FABER test  Skin:  General: Skin is warm and dry.     Capillary Refill: Capillary refill takes less than 2 seconds.     Findings: No rash.  Neurological:     Mental Status: The patient is awake and alert.      ED Results / Procedures / Treatments   Labs (all labs ordered are listed, but only abnormal results are displayed) Labs Reviewed - No data to display   EKG     RADIOLOGY I independently reviewed and interpreted imaging and agree with radiologists findings.     PROCEDURES:  Critical Care performed:   Procedures   MEDICATIONS ORDERED IN ED: Medications  lidocaine (LIDODERM) 5 % 1 patch (1 patch Transdermal Patch Applied 01/17/22 1345)  ketorolac (TORADOL) 15 MG/ML injection 15 mg (15 mg Intramuscular Given 01/17/22 1345)  morphine (PF) 4 MG/ML injection 5 mg (5 mg Intramuscular Given 01/17/22 1344)  acetaminophen (TYLENOL) tablet 650 mg (650 mg Oral Given 01/17/22 1345)     IMPRESSION / MDM / ASSESSMENT AND PLAN / ED COURSE  I reviewed the triage vital signs and the nursing  notes.   Differential diagnosis includes, but is not limited to, acute on chronic back pain, degenerative changes,spasm.   I reviewed the patient's chart.  Patient was seen earlier by urgent care who sent her to the emergency department for further evaluation given that her home medicines were not working for her.  This is a 71 year old female with a long history of back pain who presents with back pain.  She is awake and alert, hemodynamically stable and afebrile.  She is quite certain that her pain feels in the exact same location as her normal pain.  She has 5 out of 5 strength with intact sensation to extensor hallucis dorsiflexion and plantarflexion of bilateral lower extremities with normal patellar reflexes bilaterally. Most likely etiology at this point is muscle strain vs herniated disc. No red flags to indicate patient is at risk for more auspicious process that would require subspecialty evaluation at this time. No major trauma, no midline tenderness, no history or physical exam findings to suggest cauda equina syndrome or spinal cord compression. No focal neurological deficits on exam. No constitutional symptoms or history of immunosuppression or IVDA to suggest potential for epidural abscess. Not anticoagulated, no history of bleeding diastasis to suggest risk for epidural hematoma. No chronic steroid use or advanced age or history of malignancy to suggest proclivity towards pathological fracture.  No abdominal pain or flank pain to suggest kidney stone, no history of kidney stone.  No fever or dysuria or CVAT to suggest pyelonephritis .  No chest pain, back pain, shortness of breath, neurological deficits, to suggest vascular catastrophe, and pulses are equal in all 4 extremities.  CT C-spine obtained in triage demonstrates wide decompressive laminectomy and posterior fusion hardware at L4-L5 and N1-Z0 without complicating features.  There is no obvious spinal or foraminal stenosis at those  levels, though she does have moderate spinal and bilateral lateral recess stenosis at L2-L3.  Patient was treated symptomatically with improvement of her symptoms.  Upon reevaluation, she reports that she feels significantly improved and ready for discharge home.  She requested a prescription for Lidoderm patches which was prescribed for her.  She was advised to follow-up with her pain management clinic for further medications given that she is under a pain medicine contract.  Discussed care instructions and return precautions with patient. Recommended close outpatient follow-up for re-evaluation. Patient agrees with plan of care.  Will treat the patient symptomatically as needed for pain control. Will discharge patient to take these medications and return for any worsening or different pain or development of any neurologic symptoms. Educated patient regarding expected time course for back pain to improve and recommended very close outpatient follow-up.  Patient and daughter understand and agree with plan.  Patient was discharged in stable condition.   Patient's presentation is most consistent with acute complicated illness / injury requiring diagnostic workup.    FINAL CLINICAL IMPRESSION(S) / ED DIAGNOSES   Final diagnoses:  Chronic bilateral low back pain without sciatica     Rx / DC Orders   ED Discharge Orders          Ordered    lidocaine (LIDODERM) 5 %  Every 12 hours        01/17/22 1413             Note:  This document was prepared using Dragon voice recognition software and may include unintentional dictation errors.   Emeline Gins 01/17/22 1658    Lavonia Drafts, MD 01/21/22 [phone]

## 2022-01-17 NOTE — ED Triage Notes (Addendum)
Pt is with her daughter,  Pt c/o lower body pain and burning x2days  Pt states that the "bone pain" and burning started last night from the waist down. Pt has pain when standing up or laying down.   Pt states that she felt bad and had diarrha and vomiting 1 week ago and it went away, then yesterday she had nausea but it had also went away and the bone pain started that evening.  Pt has COPD.

## 2022-01-18 ENCOUNTER — Other Ambulatory Visit: Payer: Self-pay

## 2022-01-18 ENCOUNTER — Emergency Department
Admission: EM | Admit: 2022-01-18 | Discharge: 2022-01-18 | Disposition: A | Discharge status: Home / Self Care | Payer: Medicare Other | Attending: Emergency Medicine | Admitting: Emergency Medicine

## 2022-01-18 DIAGNOSIS — M5442 Lumbago with sciatica, left side: Secondary | ICD-10-CM | POA: Insufficient documentation

## 2022-01-18 DIAGNOSIS — G8929 Other chronic pain: Secondary | ICD-10-CM | POA: Diagnosis not present

## 2022-01-18 DIAGNOSIS — M545 Low back pain, unspecified: Secondary | ICD-10-CM | POA: Diagnosis present

## 2022-01-18 DIAGNOSIS — M5441 Lumbago with sciatica, right side: Secondary | ICD-10-CM | POA: Insufficient documentation

## 2022-01-18 MED ORDER — GABAPENTIN 600 MG PO TABS
600.0000 mg | ORAL_TABLET | Freq: Once | ORAL | Status: AC
  Administered 2022-01-18: 600 mg via ORAL
  Filled 2022-01-18: qty 1

## 2022-01-18 MED ORDER — CYCLOBENZAPRINE HCL 10 MG PO TABS
10.0000 mg | ORAL_TABLET | Freq: Once | ORAL | Status: AC
  Administered 2022-01-18: 10 mg via ORAL
  Filled 2022-01-18: qty 1

## 2022-01-18 NOTE — ED Provider Notes (Signed)
[facility] Emergency Department Provider Note     Event Date/Time   First MD Initiated Contact with Patient 01/18/22 1613     (approximate)   History   Back Pain   HPI  Terri James is a 71 y.o. female with history of chronic low back pain, currently under the management of Dr. Randon Goldsmith at [facility] returns to the ED for subsequent evaluation for she was evaluated in the ED yesterday, for evaluation of acute on chronic low back pain.  Patient's exam was overall reassuring and her CT did not reveal any red flags, acute neuromuscular deficit, or cord compression or foraminal stenosis.  She presents today denies any interim injury or trauma.  She reports some bilateral anterior thigh pain but denies any bladder or bowel incontinence, foot drop, saddle anesthesias.  Patient has scheduled a visit with pain management specialist for January.  Notes her current regimen includes 10 mg of cyclobenzaprine at bedtime and 600 mg of gabapentin twice daily.  Chart review reveals that she should be taking gabapentin 4 times daily.  Physical Exam   Triage Vital Signs: ED Triage Vitals  Enc Vitals Group     BP 01/18/22 1422 103/65     Pulse Rate 01/18/22 1422 (!) 108     Resp 01/18/22 1422 19     Temp 01/18/22 1422 99.2 F (37.3 C)     Temp src --      SpO2 01/18/22 1422 94 %     Weight --      Height --      Head Circumference --      Peak Flow --      Pain Score 01/18/22 1421 10     Pain Loc --      Pain Edu? --      Excl. in [place]? --     Most recent vital signs: Vitals:   01/18/22 1422  BP: 103/65  Pulse: (!) 108  Resp: 19  Temp: 99.2 F (37.3 C)  SpO2: 94%    General Awake, no distress.  CV:  Good peripheral perfusion.  RESP:  Normal effort.  ABD:  No distention.  MSK:  Normal spinal alignment without significant midline tenderness, spasm, vomiting, or step-off. NEURO: Cranial nerves II to XII grossly intact.  Normal LE DTRs  bilaterally.  Normal toe dorsiflexion on exam.   ED Results / Procedures / Treatments   Labs (all labs ordered are listed, but only abnormal results are displayed) Labs Reviewed - No data to display   EKG   RADIOLOGY   No results found.   PROCEDURES:  Critical Care performed: No  Procedures   MEDICATIONS ORDERED IN ED: Medications  cyclobenzaprine (FLEXERIL) tablet 10 mg (10 mg Oral Given 01/18/22 1739)  gabapentin (NEURONTIN) tablet 600 mg (600 mg Oral Given 01/18/22 1739)     IMPRESSION / MDM / ASSESSMENT AND PLAN / ED COURSE  I reviewed the triage vital signs and the nursing notes.                              Differential diagnosis includes, but is not limited to, lumbar strain, acute on chronic pain, radiculopathy, sciatica, HNP  Patient's presentation is most consistent with acute, uncomplicated illness.  Patient to the ED for evaluation of acute on chronic low back pain.  Patient presents in no acute distress with reassuring exam.  No red flags  on exam.  Patient is neuromuscularly intact.  Patient's diagnosis is consistent with acute exacerbation of chronic pain. Patient will be discharged home with instructions to increase her gabapentin to the prescribed 4 times daily dosing.  She will also increase her Flexeril to at least twice daily dosing.. Patient is to follow up with her PCP or pain management specialist as discussed as needed or otherwise directed. Patient is given ED precautions to return to the ED for any worsening or new symptoms.  FINAL CLINICAL IMPRESSION(S) / ED DIAGNOSES   Final diagnoses:  Chronic bilateral low back pain with bilateral sciatica     Rx / DC Orders   ED Discharge Orders     None        Note:  This document was prepared using Dragon voice recognition software and may include unintentional dictation errors.    Melvenia Needles, PA-C 01/18/22 1834    Carrie Mew, MD 01/18/22 2325

## 2022-01-18 NOTE — Discharge Instructions (Addendum)
Increase your gabapentin to 3 times daily dosing.  Increase your Flexeril to twice daily dosing.  Follow-up with your primary provider and pain management specialist for ongoing management of your chronic pain.  Return to the ED if necessary.

## 2022-01-18 NOTE — ED Triage Notes (Signed)
Pt comes with c/o back pain that started two days ago. Pt was just seen here for same yesterday. Pt states it was little better.   Pt states pain in both legs.

## 2022-04-14 ENCOUNTER — Ambulatory Visit
Admission: RE | Admit: 2022-04-14 | Discharge: 2022-04-14 | Disposition: A | Discharge status: Home / Self Care | Payer: Medicare Other | Source: Ambulatory Visit | Attending: Internal Medicine | Admitting: Internal Medicine

## 2022-04-14 DIAGNOSIS — Z87891 Personal history of nicotine dependence: Secondary | ICD-10-CM | POA: Diagnosis present

## 2022-04-14 DIAGNOSIS — I2510 Atherosclerotic heart disease of native coronary artery without angina pectoris: Secondary | ICD-10-CM | POA: Diagnosis not present

## 2022-04-14 DIAGNOSIS — J432 Centrilobular emphysema: Secondary | ICD-10-CM | POA: Insufficient documentation

## 2022-04-14 DIAGNOSIS — I700 Atherosclerosis of aorta: Secondary | ICD-10-CM | POA: Diagnosis not present

## 2022-04-14 DIAGNOSIS — F17210 Nicotine dependence, cigarettes, uncomplicated: Secondary | ICD-10-CM

## 2022-04-14 DIAGNOSIS — Z122 Encounter for screening for malignant neoplasm of respiratory organs: Secondary | ICD-10-CM | POA: Diagnosis not present

## 2022-04-18 ENCOUNTER — Other Ambulatory Visit: Payer: Self-pay | Admitting: Internal Medicine

## 2022-04-18 ENCOUNTER — Other Ambulatory Visit: Payer: Self-pay

## 2022-04-18 DIAGNOSIS — Z1231 Encounter for screening mammogram for malignant neoplasm of breast: Secondary | ICD-10-CM

## 2022-04-18 DIAGNOSIS — Z87891 Personal history of nicotine dependence: Secondary | ICD-10-CM

## 2022-04-18 DIAGNOSIS — F17210 Nicotine dependence, cigarettes, uncomplicated: Secondary | ICD-10-CM

## 2022-05-16 ENCOUNTER — Ambulatory Visit
Admission: RE | Admit: 2022-05-16 | Discharge: 2022-05-16 | Disposition: A | Discharge status: Home / Self Care | Payer: Medicare Other | Source: Ambulatory Visit | Attending: Internal Medicine | Admitting: Internal Medicine

## 2022-05-16 DIAGNOSIS — Z1231 Encounter for screening mammogram for malignant neoplasm of breast: Secondary | ICD-10-CM | POA: Diagnosis not present

## 2022-11-04 ENCOUNTER — Emergency Department
Admission: EM | Admit: 2022-11-04 | Discharge: 2022-11-04 | Disposition: A | Discharge status: Home / Self Care | Payer: Medicare Other | Attending: Emergency Medicine | Admitting: Emergency Medicine

## 2022-11-04 DIAGNOSIS — E876 Hypokalemia: Secondary | ICD-10-CM | POA: Insufficient documentation

## 2022-11-04 DIAGNOSIS — R197 Diarrhea, unspecified: Secondary | ICD-10-CM | POA: Insufficient documentation

## 2022-11-04 DIAGNOSIS — J449 Chronic obstructive pulmonary disease, unspecified: Secondary | ICD-10-CM | POA: Insufficient documentation

## 2022-11-04 DIAGNOSIS — I2510 Atherosclerotic heart disease of native coronary artery without angina pectoris: Secondary | ICD-10-CM | POA: Insufficient documentation

## 2022-11-04 DIAGNOSIS — E119 Type 2 diabetes mellitus without complications: Secondary | ICD-10-CM | POA: Diagnosis not present

## 2022-11-04 LAB — COMPREHENSIVE METABOLIC PANEL
ALT: 16 U/L (ref 0–44)
AST: 22 U/L (ref 15–41)
Albumin: 3.8 g/dL (ref 3.5–5.0)
Alkaline Phosphatase: 70 U/L (ref 38–126)
Anion gap: 13 (ref 5–15)
BUN: 6 mg/dL — ABNORMAL LOW (ref 8–23)
CO2: 32 mmol/L (ref 22–32)
Calcium: 9.1 mg/dL (ref 8.9–10.3)
Chloride: 94 mmol/L — ABNORMAL LOW (ref 98–111)
Creatinine, Ser: 0.74 mg/dL (ref 0.44–1.00)
GFR, Estimated: >60 mL/min (ref >60)
Glucose, Bld: 144 mg/dL — ABNORMAL HIGH (ref 70–99)
Potassium: 2.5 mmol/L — CL (ref 3.5–5.1)
Sodium: 139 mmol/L (ref 135–145)
Total Bilirubin: 0.8 mg/dL (ref 0.3–1.2)
Total Protein: 7 g/dL (ref 6.5–8.1)

## 2022-11-04 LAB — CBC
HCT: 43.4 % (ref 36.0–46.0)
Hemoglobin: 14.3 g/dL (ref 12.0–15.0)
MCH: 33.1 pg (ref 26.0–34.0)
MCHC: 32.9 g/dL (ref 30.0–36.0)
MCV: 100.5 fL — ABNORMAL HIGH (ref 80.0–100.0)
Platelets: 246 10*3/uL (ref 150–400)
RBC: 4.32 MIL/uL (ref 3.87–5.11)
RDW: 11.7 % (ref 11.5–15.5)
WBC: 10.3 10*3/uL (ref 4.0–10.5)
nRBC: 0 % (ref 0.0–0.2)

## 2022-11-04 LAB — URINALYSIS, ROUTINE W REFLEX MICROSCOPIC
Bilirubin Urine: NEGATIVE
Glucose, UA: NEGATIVE mg/dL
Hgb urine dipstick: NEGATIVE
Ketones, ur: NEGATIVE mg/dL
Leukocytes,Ua: NEGATIVE
Nitrite: NEGATIVE
Protein, ur: NEGATIVE mg/dL
Specific Gravity, Urine: 1.004 — ABNORMAL LOW (ref 1.005–1.030)
pH: 6 (ref 5.0–8.0)

## 2022-11-04 MED ORDER — POTASSIUM CHLORIDE CRYS ER 20 MEQ PO TBCR: 20.0000 meq | EXTENDED_RELEASE_TABLET | Freq: Two times a day (BID) | ORAL | 0 refills | Status: AC

## 2022-11-04 MED ORDER — POTASSIUM CHLORIDE 10 MEQ/100ML IV SOLN
10.0000 meq | Freq: Once | INTRAVENOUS | Status: AC
  Administered 2022-11-04: 10 meq via INTRAVENOUS
  Filled 2022-11-04: qty 100

## 2022-11-04 MED ORDER — POTASSIUM CHLORIDE CRYS ER 20 MEQ PO TBCR
40.0000 meq | EXTENDED_RELEASE_TABLET | Freq: Once | ORAL | Status: AC
  Administered 2022-11-04: 40 meq via ORAL
  Filled 2022-11-04: qty 2

## 2022-11-04 MED ORDER — SODIUM CHLORIDE 0.9 % IV BOLUS
1000.0000 mL | Freq: Once | INTRAVENOUS | Status: AC
  Administered 2022-11-04: 1000 mL via INTRAVENOUS

## 2022-11-04 NOTE — ED Notes (Signed)
ED Provider at bedside. 

## 2022-11-04 NOTE — Discharge Instructions (Addendum)
Take the potassium as prescribed.  Follow-up with your doctor within the next week to have your potassium level rechecked.  Return to the ER for new, worsening, or persistent severe diarrhea, increasing frequency, blood in the stool, abdominal pain, fever, vomiting, weakness, or any other new or worsening symptoms that concern you.

## 2022-11-04 NOTE — ED Notes (Signed)
Pt ambulated to restroom and back to bed safely. Only voided no BM.

## 2022-11-04 NOTE — ED Provider Notes (Signed)
[facility] Provider Note    Event Date/Time   First MD Initiated Contact with Patient 11/04/22 1753     (approximate)   History   Diarrhea   HPI  HILINAI SACCENTE is a 72 y.o. female with a history of COPD, diabetes, hyperlipidemia, GERD, CAD, and depression who presents with dad for the last week, persistent course, watery and nonbloody, not associated with any abdominal pain.  The patient denies any fever or chills.  She has no nausea or vomiting and has been able to eat.  She denies any specific sick contacts.  She was on antibiotics for UTI earlier this month but has not been hospitalized recently.  I reviewed the past medical records per the patient's most recent outpatient encounter was with internal medicine on 9/4 for follow-up of her chronic conditions.   Physical Exam   Triage Vital Signs: ED Triage Vitals [11/04/22 1709]  Encounter Vitals Group     BP 107/67     Systolic BP Percentile      Diastolic BP Percentile      Pulse Rate 96     Resp 16     Temp 99.1 F (37.3 C)     Temp Source Oral     SpO2 96 %     Weight 140 lb (63.5 kg)     Height 5\' 6"  (1.676 m)     Head Circumference      Peak Flow      Pain Score 7     Pain Loc      Pain Education      Exclude from Growth Chart     Most recent vital signs: Vitals:   11/04/22 2142 11/04/22 2218  BP:  132/75  Pulse: 87 78  Resp: 20 18  Temp:  98.8 F (37.1 C)  SpO2: 100% 100%     General: Awake, no distress.  CV:  Good peripheral perfusion.  Resp:  Normal effort.  Abd:  Soft and nontender.  No distention.  Other:  Dry mucous membranes.   ED Results / Procedures / Treatments   Labs (all labs ordered are listed, but only abnormal results are displayed) Labs Reviewed  COMPREHENSIVE METABOLIC PANEL - Abnormal; Notable for the following components:      Result Value   Potassium 2.5 (*)    Chloride 94 (*)    Glucose, Bld 144 (*)    BUN 6 (*)    All other components  within normal limits  CBC - Abnormal; Notable for the following components:   MCV 100.5 (*)    All other components within normal limits  URINALYSIS, ROUTINE W REFLEX MICROSCOPIC - Abnormal; Notable for the following components:   Color, Urine YELLOW (*)    APPearance CLEAR (*)    Specific Gravity, Urine 1.004 (*)    All other components within normal limits  GASTROINTESTINAL PANEL BY PCR, STOOL (REPLACES STOOL CULTURE)     EKG  ED ECG REPORT I, Dionne Bucy, the attending physician, personally viewed and interpreted this ECG.  Date: 11/04/2022 EKG Time: 1938 Rate: 89 Rhythm: normal sinus rhythm QRS Axis: normal Intervals: Borderline IVCD ST/T Wave abnormalities: normal Narrative Interpretation: no evidence of acute ischemia    RADIOLOGY    PROCEDURES:  Critical Care performed: No  Procedures   MEDICATIONS ORDERED IN ED: Medications  sodium chloride 0.9 % bolus 1,000 mL (0 mLs Intravenous Stopped 11/04/22 2143)  potassium chloride 10 mEq in 100 mL IVPB (0  mEq Intravenous Stopped 11/04/22 2143)  potassium chloride 10 mEq in 100 mL IVPB (0 mEq Intravenous Stopped 11/04/22 2038)  potassium chloride SA (KLOR-CON M) CR tablet 40 mEq (40 mEq Oral Given 11/04/22 2216)     IMPRESSION / MDM / ASSESSMENT AND PLAN / ED COURSE  I reviewed the triage vital signs and the nursing notes.  72 year old female with PMH as noted above presents with diarrhea for the last week.  She has no significant abdominal pain.  On exam her vital signs are normal except for borderline elevated temperature.  Abdomen soft and nontender.  Mucous membranes are dry.  Differential diagnosis includes, but is not limited to, viral enterocolitis, other viral infection, less likely bacterial etiology.  There is no evidence of acute colitis or diverticulitis.  There is no indication for imaging based on the lack of any abdominal pain or tenderness.  Initial lab workup significant for hypokalemia.  Other  electrolytes are normal.  There is no leukocytosis or anemia.  We will give fluids, IV potassium, and reassess.  Patient's presentation is most consistent with acute complicated illness / injury requiring diagnostic workup.  The patient is on the cardiac monitor to evaluate for evidence of arrhythmia and/or significant heart rate changes.   ----------------------------------------- 10:01 PM on 11/04/2022 -----------------------------------------  The patient is feeling better after fluids and IV potassium repletion.  I have ordered oral potassium as well.  EKG shows no sequela of hypokalemia, and the patient is stable for discharge home.  She has not provided a stool sample.  Overall I suspect likely infectious diarrhea although viral etiology is more likely.  There is no indication for antibiotics at this time.  I have prescribed oral potassium for home and instructed the patient to follow-up with her primary care provider.  I gave strict return precautions and the patient expressed understanding.    FINAL CLINICAL IMPRESSION(S) / ED DIAGNOSES   Final diagnoses:  Diarrhea of presumed infectious origin  Hypokalemia     Rx / DC Orders   ED Discharge Orders          Ordered    potassium chloride SA (KLOR-CON M) 20 MEQ tablet  2 times daily        11/04/22 2202             Note:  This document was prepared using Dragon voice recognition software and may include unintentional dictation errors.    Dionne Bucy, MD 11/04/22 2329

## 2022-11-04 NOTE — ED Triage Notes (Signed)
Pt to ED c/o diarrhea x 1 week. No known sick contacts.

## 2022-11-04 NOTE — ED Notes (Signed)
Provided pt with discharge instructions and education. All of pt questions answered. Pt in possession of all belongings. Pt AAOX4 and stable at time of discharge.Pt ambulated w/ steady gait towards ED exit. Pt accompanied by family member.

## 2022-11-04 NOTE — ED Notes (Signed)
Assumed care of pt at this time. Pt is AAXO4, on CCM, VS stable and WNL. No needs identified at this time.

## 2023-01-30 ENCOUNTER — Other Ambulatory Visit: Payer: Self-pay | Admitting: Physician Assistant

## 2023-01-30 DIAGNOSIS — R7989 Other specified abnormal findings of blood chemistry: Secondary | ICD-10-CM

## 2023-02-08 ENCOUNTER — Ambulatory Visit: Admission: RE | Admit: 2023-02-08 | Payer: Medicare Other | Source: Ambulatory Visit

## 2023-02-12 ENCOUNTER — Ambulatory Visit
Admission: RE | Admit: 2023-02-12 | Discharge: 2023-02-12 | Disposition: A | Discharge status: Home / Self Care | Payer: Medicare Other | Source: Ambulatory Visit | Attending: Physician Assistant | Admitting: Physician Assistant

## 2023-02-12 DIAGNOSIS — R7989 Other specified abnormal findings of blood chemistry: Secondary | ICD-10-CM | POA: Insufficient documentation

## 2023-04-09 ENCOUNTER — Other Ambulatory Visit: Payer: Self-pay | Admitting: Internal Medicine

## 2023-04-09 DIAGNOSIS — Z87891 Personal history of nicotine dependence: Secondary | ICD-10-CM

## 2023-04-09 DIAGNOSIS — Z122 Encounter for screening for malignant neoplasm of respiratory organs: Secondary | ICD-10-CM

## 2023-04-19 ENCOUNTER — Other Ambulatory Visit: Payer: Self-pay | Admitting: Internal Medicine

## 2023-04-19 DIAGNOSIS — Z1231 Encounter for screening mammogram for malignant neoplasm of breast: Secondary | ICD-10-CM

## 2023-04-20 ENCOUNTER — Ambulatory Visit
Admission: RE | Admit: 2023-04-20 | Discharge: 2023-04-20 | Disposition: A | Discharge status: Home / Self Care | Payer: Medicare Other | Source: Ambulatory Visit | Attending: Internal Medicine | Admitting: Internal Medicine

## 2023-04-20 DIAGNOSIS — Z122 Encounter for screening for malignant neoplasm of respiratory organs: Secondary | ICD-10-CM | POA: Insufficient documentation

## 2023-04-20 DIAGNOSIS — Z87891 Personal history of nicotine dependence: Secondary | ICD-10-CM | POA: Insufficient documentation

## 2023-05-17 ENCOUNTER — Ambulatory Visit
Admission: RE | Admit: 2023-05-17 | Discharge: 2023-05-17 | Disposition: A | Discharge status: Home / Self Care | Source: Ambulatory Visit | Attending: Internal Medicine | Admitting: Internal Medicine

## 2023-05-17 DIAGNOSIS — Z1231 Encounter for screening mammogram for malignant neoplasm of breast: Secondary | ICD-10-CM | POA: Diagnosis present

## 2024-03-03 ENCOUNTER — Encounter: Payer: Self-pay | Admitting: Gastroenterology

## 2024-03-12 NOTE — H&P (Signed)
 "  Pre-Procedure H&P   Patient ID: Terri James is a 74 y.o. female.  Gastroenterology Provider: Elspeth Ozell Jungling, DO  Referring Provider: Wanda Gails, NP PCP: Lenon Layman ORN, MD  Date: 03/13/2024  HPI Terri James is a 74 y.o. female who presents today for Colonoscopy for Personal and family history of colon polyps .  Last underwent colonoscopy in 2023 with BBPS 6.  6 tubular adenomas were removed.  Internal hemorrhoids also noted   Past Medical History:  Diagnosis Date   Arthritis    Cancer (HCC) 2003   left breast   Chronic low back pain    continuous use of opioids   COPD (chronic obstructive pulmonary disease) (HCC)    Depression    Emphysema lung (HCC)    GERD (gastroesophageal reflux disease)    History of hiatal hernia    HOH (hard of hearing)    Hyperlipidemia    Left knee pain    Osteoporosis    Personal history of chemotherapy 2003   Left breast ca   Personal history of radiation therapy 2003   left breast ca   Pneumonia    Pre-diabetes     Past Surgical History:  Procedure Laterality Date   ABDOMINAL HYSTERECTOMY     BACK SURGERY  2002   fusion with screws, second surgery/   BREAST EXCISIONAL BIOPSY Left 2003   positive   BREAST LUMPECTOMY Left 2003   f/u with chemo and radiation    CATARACT EXTRACTION W/PHACO Right 08/24/2015   Procedure: CATARACT EXTRACTION PHACO AND INTRAOCULAR LENS PLACEMENT (IOC);  Surgeon: Elsie Carmine, MD;  Location: [facility] ORS;  Service: Ophthalmology;  Laterality: Right;  US  1.02 AP% 18.2 CDE 11.28 Fluid Pack lot # [id number]   CATARACT EXTRACTION W/PHACO Left 05/09/2016   Procedure: CATARACT EXTRACTION PHACO AND INTRAOCULAR LENS PLACEMENT (IOC);  Surgeon: Elsie Carmine, MD;  Location: [facility] ORS;  Service: Ophthalmology;  Laterality: Left;  US  00:49 AP% 17.2 CDE 8.45 Fluid pack lot # [id number]   COLONOSCOPY N/A 03/28/2021   Procedure: COLONOSCOPY;  Surgeon: Jungling Elspeth Ozell, DO;   Location: [facility] ENDOSCOPY;  Service: Gastroenterology;  Laterality: N/A;  DIET CONTROLLED   COLONOSCOPY WITH PROPOFOL  N/A 08/06/2015   Procedure: COLONOSCOPY WITH PROPOFOL ;  Surgeon: Lamar ONEIDA Holmes, MD;  Location: [facility] ENDOSCOPY;  Service: Endoscopy;  Laterality: N/A;   DILATION AND CURETTAGE OF UTERUS     ESOPHAGOGASTRODUODENOSCOPY N/A 03/28/2021   Procedure: ESOPHAGOGASTRODUODENOSCOPY (EGD);  Surgeon: Jungling Elspeth Ozell, DO;  Location: [facility] ENDOSCOPY;  Service: Gastroenterology;  Laterality: N/A;   ESOPHAGOGASTRODUODENOSCOPY (EGD) WITH PROPOFOL  N/A 08/06/2015   Procedure: ESOPHAGOGASTRODUODENOSCOPY (EGD) WITH PROPOFOL ;  Surgeon: Lamar ONEIDA Holmes, MD;  Location: [facility] ENDOSCOPY;  Service: Endoscopy;  Laterality: N/A;   EYE SURGERY     ganglion cyst removal Left    POSTERIOR FUSION LUMBAR SPINE     x2   TONSILLECTOMY      Family History Fhx- colon polyps No other h/o GI disease or malignancy  Review of Systems  Constitutional:  Negative for activity change, appetite change, chills, diaphoresis, fatigue, fever and unexpected weight change.  HENT:  Negative for trouble swallowing and voice change.   Respiratory:  Negative for shortness of breath and wheezing.   Cardiovascular:  Negative for chest pain, palpitations and leg swelling.  Gastrointestinal:  Negative for abdominal distention, abdominal pain, anal bleeding, blood in stool, constipation, diarrhea, nausea, rectal pain and vomiting.  Musculoskeletal:  Negative for arthralgias and myalgias.  Skin:  Negative for color change and pallor.  Neurological:  Negative for dizziness, syncope and weakness.  Psychiatric/Behavioral:  Negative for confusion.   All other systems reviewed and are negative.    Medications Medications Ordered Prior to Encounter[1]  Pertinent medications related to GI and procedure were reviewed by me with the patient prior to the procedure  Current Medications[2]  sodium chloride  20 mL/hr at 03/13/24 9271        Allergies[3] Allergies were reviewed by me prior to the procedure  Objective   Body mass index is 26.39 kg/m. Vitals:   03/13/24 0711  BP: 109/69  Pulse: (!) 103  Resp: 20  Temp: (!) 97.2 F (36.2 C)  TempSrc: Tympanic  SpO2: 100%  Weight: 67.6 kg  Height: 5' 3 (1.6 m)     Physical Exam Vitals and nursing note reviewed.  Constitutional:      General: She is not in acute distress.    Appearance: Normal appearance. She is not ill-appearing, toxic-appearing or diaphoretic.  HENT:     Head: Normocephalic and atraumatic.     Nose: Nose normal.     Mouth/Throat:     Mouth: Mucous membranes are moist.     Pharynx: Oropharynx is clear.  Eyes:     General: No scleral icterus.    Extraocular Movements: Extraocular movements intact.  Cardiovascular:     Rate and Rhythm: Regular rhythm. Tachycardia present.     Heart sounds: Normal heart sounds. No murmur heard.    No friction rub. No gallop.  Pulmonary:     Effort: Pulmonary effort is normal. No respiratory distress.     Breath sounds: Normal breath sounds. No wheezing, rhonchi or rales.  Abdominal:     General: Bowel sounds are normal. There is no distension.     Palpations: Abdomen is soft.     Tenderness: There is no abdominal tenderness. There is no guarding or rebound.  Musculoskeletal:     Cervical back: Neck supple.     Right lower leg: No edema.     Left lower leg: No edema.  Skin:    General: Skin is warm and dry.     Coloration: Skin is not jaundiced or pale.  Neurological:     General: No focal deficit present.     Mental Status: She is alert and oriented to person, place, and time. Mental status is at baseline.  Psychiatric:        Mood and Affect: Mood normal.        Behavior: Behavior normal.        Thought Content: Thought content normal.        Judgment: Judgment normal.      Assessment:  Terri James is a 74 y.o. female  who presents today for Colonoscopy for Personal and  family history of colon polyps .  Plan:  Colonoscopy with possible intervention today  Colonoscopy with possible biopsy, control of bleeding, polypectomy, and interventions as necessary has been discussed with the patient/patient representative. Informed consent was obtained from the patient/patient representative after explaining the indication, nature, and risks of the procedure including but not limited to death, bleeding, perforation, missed neoplasm/lesions, cardiorespiratory compromise, and reaction to medications. Opportunity for questions was given and appropriate answers were provided. Patient/patient representative has verbalized understanding is amenable to undergoing the procedure.   Elspeth Ozell Jungling, DO  [facility]astroenter[facility]  Portions of the record may have been created with voice recognition software. Occasional wrong-word or 'sound-a-like' substitutions  may have occurred due to the inherent limitations of voice recognition software.  Read the chart carefully and recognize, using context, where substitutions may have occurred.     [1]  No current facility-administered medications on file prior to encounter.   Current Outpatient Medications on File Prior to Encounter  Medication Sig Dispense Refill   furosemide (LASIX) 10 MG/ML solution Take 10 mg by mouth daily.     gabapentin  (NEURONTIN ) 600 MG tablet Take 600 mg by mouth 4 (four) times daily.     morphine  (MS CONTIN ) 15 MG 12 hr tablet Take 15 mg by mouth every 8 (eight) hours.     pantoprazole  (PROTONIX ) 40 MG tablet Take 40 mg by mouth daily.     acetaminophen  (TYLENOL ) 325 MG tablet Take 650 mg by mouth 2 (two) times daily as needed for moderate pain or headache.      Calcium  Carbonate-Vitamin D  600-125 MG-UNIT TABS Take 1 tablet by mouth daily.     clonazePAM  (KLONOPIN ) 0.5 MG tablet Take 0.25 mg by mouth daily as needed for anxiety.      cyclobenzaprine  (FLEXERIL ) 10 MG tablet Take 10 mg by mouth at  bedtime.     HYDROcodone -acetaminophen  (NORCO) 5-325 MG tablet Take 1 tablet by mouth every 6 (six) hours as needed for up to 6 doses for moderate pain. 6 tablet 0   ibuprofen  (ADVIL ) 800 MG tablet Take 1 tablet (800 mg total) by mouth every 8 (eight) hours as needed for mild pain or moderate pain. 30 tablet 0   lidocaine  (XYLOCAINE ) 2 % solution Use as directed 15 mLs in the mouth or throat every 3 (three) hours as needed for mouth pain (swish and spit). 100 mL 0   Multiple Vitamin (MULTIVITAMIN WITH MINERALS) TABS tablet Take 1 tablet by mouth daily.     naloxone (NARCAN) nasal spray 4 mg/0.1 mL Place into the nose.     potassium chloride  SA (KLOR-CON  M) 20 MEQ tablet Take 1 tablet (20 mEq total) by mouth 2 (two) times daily for 7 days. 14 tablet 0   simvastatin  (ZOCOR ) 40 MG tablet Take 40 mg by mouth at bedtime.     [2]  Current Facility-Administered Medications:    0.9 %  sodium chloride  infusion, , Intravenous, Continuous, Onita Elspeth Sharper, DO, Last Rate: 20 mL/hr at 03/13/24 9271, Continued from Pre-op at 03/13/24 9271 [3]  Allergies Allergen Reactions   Baclofen Other (See Comments)    Insomnia, bad dreams    "

## 2024-03-13 ENCOUNTER — Other Ambulatory Visit: Payer: Self-pay

## 2024-03-13 ENCOUNTER — Ambulatory Visit
Admission: RE | Admit: 2024-03-13 | Discharge: 2024-03-13 | Disposition: A | Discharge status: Home / Self Care | Attending: Gastroenterology | Admitting: Gastroenterology

## 2024-03-13 ENCOUNTER — Encounter: Payer: Self-pay | Admitting: Gastroenterology

## 2024-03-13 ENCOUNTER — Encounter
Admission: RE | Disposition: A | Discharge status: Home / Self Care | Payer: Self-pay | Source: Home / Self Care | Attending: Gastroenterology

## 2024-03-13 DIAGNOSIS — Z87891 Personal history of nicotine dependence: Secondary | ICD-10-CM | POA: Diagnosis not present

## 2024-03-13 DIAGNOSIS — Z1211 Encounter for screening for malignant neoplasm of colon: Secondary | ICD-10-CM | POA: Insufficient documentation

## 2024-03-13 DIAGNOSIS — M1990 Unspecified osteoarthritis, unspecified site: Secondary | ICD-10-CM | POA: Insufficient documentation

## 2024-03-13 DIAGNOSIS — D128 Benign neoplasm of rectum: Secondary | ICD-10-CM | POA: Insufficient documentation

## 2024-03-13 DIAGNOSIS — D123 Benign neoplasm of transverse colon: Secondary | ICD-10-CM | POA: Insufficient documentation

## 2024-03-13 DIAGNOSIS — K640 First degree hemorrhoids: Secondary | ICD-10-CM | POA: Diagnosis not present

## 2024-03-13 HISTORY — DX: Prediabetes: R73.03

## 2024-03-13 MED ORDER — PHENYLEPHRINE 80 MCG/ML (10ML) SYRINGE FOR IV PUSH (FOR BLOOD PRESSURE SUPPORT)
PREFILLED_SYRINGE | INTRAVENOUS | Status: DC | PRN
  Administered 2024-03-13 (×2): 120 ug via INTRAVENOUS

## 2024-03-13 MED ORDER — SODIUM CHLORIDE 0.9 % IV SOLN: INTRAVENOUS | Status: DC

## 2024-03-13 MED ORDER — PROPOFOL 500 MG/50ML IV EMUL
INTRAVENOUS | Status: DC | PRN
  Administered 2024-03-13: 75 ug/kg/min via INTRAVENOUS

## 2024-03-13 MED ORDER — LIDOCAINE HCL (CARDIAC) PF 100 MG/5ML IV SOSY
PREFILLED_SYRINGE | INTRAVENOUS | Status: DC | PRN
  Administered 2024-03-13: 60 mg via INTRAVENOUS

## 2024-03-13 MED ORDER — PROPOFOL 10 MG/ML IV BOLUS
INTRAVENOUS | Status: DC | PRN
  Administered 2024-03-13: 50 mg via INTRAVENOUS
  Administered 2024-03-13: 30 mg via INTRAVENOUS

## 2024-03-13 NOTE — Interval H&P Note (Signed)
 History and Physical Interval Note: Preprocedure H&P from 03/13/24  was reviewed and there was no interval change after seeing and examining the patient.  Written consent was obtained from the patient after discussion of risks, benefits, and alternatives. Patient has consented to proceed with Colonoscopy with possible intervention   03/13/2024 7:29 AM  Terri James  has presented today for surgery, with the diagnosis of History of adenomatous polyp of colon (Z86.0101).  The various methods of treatment have been discussed with the patient and family. After consideration of risks, benefits and other options for treatment, the patient has consented to  Procedures: COLONOSCOPY (N/A) as a surgical intervention.  The patient's history has been reviewed, patient examined, no change in status, stable for surgery.  I have reviewed the patient's chart and labs.  Questions were answered to the patient's satisfaction.     Elspeth Ozell Jungling

## 2024-03-13 NOTE — Op Note (Signed)
 [facility] Patient Name: Terri James Procedure Date: 03/13/2024 7:33 AM MRN: [id number] Account #: [id number] Date of Birth: [birth year] Admit Type: Outpatient Age: 74 Room: [facility] ENDO ROOM 1 Gender: Female Note Status: Finalized Instrument Name: Colon Scope [id number] Procedure:             Colonoscopy Indications:           High risk colon cancer surveillance: Personal history                         of colonic polyps Providers:             Elspeth Ozell Onita ROSALEA, DO Referring MD:          Layman ORN. Lenon MD, MD (Referring MD) Medicines:             Monitored Anesthesia Care Complications:         No immediate complications. Estimated blood loss:                         Minimal. Procedure:             Pre-Anesthesia Assessment:                        - Prior to the procedure, a History and Physical was                         performed, and patient medications and allergies were                         reviewed. The patient is competent. The risks and                         benefits of the procedure and the sedation options and                         risks were discussed with the patient. All questions                         were answered and informed consent was obtained.                         Patient identification and proposed procedure were                         verified by the physician, the nurse, the anesthetist                         and the technician in the endoscopy suite. Mental                         Status Examination: alert and oriented. Airway                         Examination: normal oropharyngeal airway and neck                         mobility. Respiratory Examination: clear to  auscultation. CV Examination: RRR, no murmurs, no S3                         or S4. Prophylactic Antibiotics: The patient does not                         require prophylactic antibiotics. Prior                          Anticoagulants: The patient has taken no anticoagulant                         or antiplatelet agents. ASA Grade Assessment: III - A                         patient with severe systemic disease. After reviewing                         the risks and benefits, the patient was deemed in                         satisfactory condition to undergo the procedure. The                         anesthesia plan was to use monitored anesthesia care                         (MAC). Immediately prior to administration of                         medications, the patient was re-assessed for adequacy                         to receive sedatives. The heart rate, respiratory                         rate, oxygen saturations, blood pressure, adequacy of                         pulmonary ventilation, and response to care were                         monitored throughout the procedure. The physical                         status of the patient was re-assessed after the                         procedure.                        After obtaining informed consent, the colonoscope was                         passed under direct vision. Throughout the procedure,                         the patient's blood pressure, pulse, and oxygen  saturations were monitored continuously. The                         Colonoscope was introduced through the anus and                         advanced to the the cecum, identified by appendiceal                         orifice and ileocecal valve. The colonoscopy was                         performed without difficulty. The patient tolerated                         the procedure well. The quality of the bowel                         preparation was evaluated using the BBPS [employer] [facility] Bowel                         Preparation Scale) with scores of: Right Colon = 2                         (minor amount of residual staining, small fragments of                          stool and/or opaque liquid, but mucosa seen well),                         Transverse Colon = 2 (minor amount of residual                         staining, small fragments of stool and/or opaque                         liquid, but mucosa seen well) and Left Colon = 2                         (minor amount of residual staining, small fragments of                         stool and/or opaque liquid, but mucosa seen well). The                         total BBPS score equals 6. Fair Prep. The ileocecal                         valve, appendiceal orifice, and rectum were                         photographed. Findings:      The perianal and digital rectal examinations were normal. Pertinent       negatives include normal sphincter tone.      Two sessile polyps were found in the rectum and transverse colon. The       polyps were 1 to 2 mm in size. These polyps  were removed with a jumbo       cold forceps. Resection and retrieval were complete. Estimated blood       loss was minimal.      Non-bleeding internal hemorrhoids were found during retroflexion. The       hemorrhoids were Grade I (internal hemorrhoids that do not prolapse).       Estimated blood loss: none.      The exam was otherwise without abnormality on direct and retroflexion       views. Impression:            - Two 1 to 2 mm polyps in the rectum and in the                         transverse colon, removed with a jumbo cold forceps.                         Resected and retrieved.                        - Non-bleeding internal hemorrhoids.                        - The examination was otherwise normal on direct and                         retroflexion views. Recommendation:        - Patient has a contact number available for                         emergencies. The signs and symptoms of potential                         delayed complications were discussed with the patient.                         Return to normal activities tomorrow.  Written                         discharge instructions were provided to the patient.                        - Discharge patient to home.                        - Resume previous diet.                        - Continue present medications.                        - Await pathology results.                        - Repeat colonoscopy for surveillance based on                         pathology results.                        - Return to referring physician as previously  scheduled.                        - The findings and recommendations were discussed with                         the patient. Procedure Code(s):     --- Professional ---                        [phone], Colonoscopy, flexible; with biopsy, single or                         multiple Diagnosis Code(s):     --- Professional ---                        Z86.010, Personal history of colonic polyps                        D12.8, Benign neoplasm of rectum                        D12.3, Benign neoplasm of transverse colon (hepatic                         flexure or splenic flexure)                        K64.0, First degree hemorrhoids CPT copyright 2022 [employer]. All rights reserved. The codes documented in this report are preliminary and upon coder review may  be revised to meet current compliance requirements. Attending Participation:      I personally performed the entire procedure. Elspeth Jungling, DO Elspeth Ozell Jungling DO, DO 03/13/2024 8:11:24 AM This report has been signed electronically. Number of Addenda: 0 Note Initiated On: 03/13/2024 7:33 AM Scope Withdrawal Time: 0 hours 11 minutes 25 seconds  Total Procedure Duration: 0 hours 20 minutes 16 seconds  Estimated Blood Loss:  Estimated blood loss was minimal.      [facility]

## 2024-03-13 NOTE — Anesthesia Postprocedure Evaluation (Signed)
"   Anesthesia Post Note  Patient: Terri James  Procedure(s) Performed: COLONOSCOPY  Patient location during evaluation: PACU Anesthesia Type: General Level of consciousness: awake and alert Pain management: satisfactory to patient Vital Signs Assessment: post-procedure vital signs reviewed and stable Respiratory status: spontaneous breathing Cardiovascular status: stable Anesthetic complications: no   No notable events documented.   Last Vitals:  Vitals:   03/13/24 0823 03/13/24 0834  BP: 105/76 (!) 105/59  Pulse: 82 82  Resp: 14 11  Temp:    SpO2: 99% 100%    Last Pain:  Vitals:   03/13/24 0823  TempSrc:   PainSc: 0-No pain                 VAN STAVEREN,[name]      "

## 2024-03-13 NOTE — Transfer of Care (Signed)
 Immediate Anesthesia Transfer of Care Note  Patient: Terri James  Procedure(s) Performed: COLONOSCOPY  Patient Location: PACU  Anesthesia Type:General  Level of Consciousness: sedated  Airway & Oxygen Therapy: Patient Spontanous Breathing  Post-op Assessment: Report given to RN and Post -op Vital signs reviewed and stable  Post vital signs: Reviewed and stable  Last Vitals:  Vitals Value Taken Time  BP    Temp    Pulse    Resp    SpO2      Last Pain:  Vitals:   03/13/24 0711  TempSrc: Tympanic  PainSc: 0-No pain         Complications: No notable events documented.

## 2024-03-13 NOTE — Anesthesia Preprocedure Evaluation (Signed)
 "                                  Anesthesia Evaluation  Patient identified by MRN, date of birth, ID band Patient awake    Reviewed: Allergy & Precautions, NPO status , Patient's Chart, lab work & pertinent test results  Airway Mallampati: II  TM Distance: >3 FB Neck ROM: Full    Dental  (+) Upper Dentures   Pulmonary neg pulmonary ROS, COPD,  COPD inhaler, former smoker   Pulmonary exam normal  + decreased breath sounds      Cardiovascular Exercise Tolerance: Good negative cardio ROS Normal cardiovascular exam Rhythm:Regular Rate:Normal     Neuro/Psych    Depression    negative neurological ROS  negative psych ROS   GI/Hepatic negative GI ROS, Neg liver ROS, hiatal hernia,GERD  Medicated,,  Endo/Other  negative endocrine ROS    Renal/GU negative Renal ROS  negative genitourinary   Musculoskeletal  (+) Arthritis ,    Abdominal   Peds negative pediatric ROS (+)  Hematology negative hematology ROS (+)   Anesthesia Other Findings Past Medical History: No date: Arthritis 2003: Cancer (HCC)     Comment:  left breast No date: Chronic low back pain     Comment:  continuous use of opioids No date: COPD (chronic obstructive pulmonary disease) (HCC) No date: Depression No date: Emphysema lung (HCC) No date: GERD (gastroesophageal reflux disease) No date: History of hiatal hernia No date: HOH (hard of hearing) No date: Hyperlipidemia No date: Left knee pain No date: Osteoporosis 2003: Personal history of chemotherapy     Comment:  Left breast ca 2003: Personal history of radiation therapy     Comment:  left breast ca No date: Pneumonia No date: Pre-diabetes  Past Surgical History: No date: ABDOMINAL HYSTERECTOMY 2002: BACK SURGERY     Comment:  fusion with screws, second surgery/ 2003: BREAST EXCISIONAL BIOPSY; Left     Comment:  positive 2003: BREAST LUMPECTOMY; Left     Comment:  f/u with chemo and radiation  08/24/2015: CATARACT  EXTRACTION W/PHACO; Right     Comment:  Procedure: CATARACT EXTRACTION PHACO AND INTRAOCULAR               LENS PLACEMENT (IOC);  Surgeon: Elsie Carmine, MD;                Location: [facility] ORS;  Service: Ophthalmology;  Laterality:              Right;  US  1.02 AP% 18.2 CDE 11.28 Fluid Pack lot #               [id number] 05/09/2016: CATARACT EXTRACTION W/PHACO; Left     Comment:  Procedure: CATARACT EXTRACTION PHACO AND INTRAOCULAR               LENS PLACEMENT (IOC);  Surgeon: Elsie Carmine, MD;                Location: [facility] ORS;  Service: Ophthalmology;  Laterality:              Left;  US  00:49 AP% 17.2 CDE 8.45 Fluid pack lot #               [id number] 03/28/2021: COLONOSCOPY; N/A     Comment:  Procedure: COLONOSCOPY;  Surgeon: Onita Elspeth Sharper,              DO;  Location: [facility] ENDOSCOPY;  Service:               Gastroenterology;  Laterality: N/A;  DIET CONTROLLED 08/06/2015: COLONOSCOPY WITH PROPOFOL ; N/A     Comment:  Procedure: COLONOSCOPY WITH PROPOFOL ;  Surgeon: Lamar ONEIDA Holmes, MD;  Location: [facility] ENDOSCOPY;  Service:               Endoscopy;  Laterality: N/A; No date: DILATION AND CURETTAGE OF UTERUS 03/28/2021: ESOPHAGOGASTRODUODENOSCOPY; N/A     Comment:  Procedure: ESOPHAGOGASTRODUODENOSCOPY (EGD);  Surgeon:               Onita Elspeth Sharper, DO;  Location: [facility] ENDOSCOPY;                Service: Gastroenterology;  Laterality: N/A; 08/06/2015: ESOPHAGOGASTRODUODENOSCOPY (EGD) WITH PROPOFOL ; N/A     Comment:  Procedure: ESOPHAGOGASTRODUODENOSCOPY (EGD) WITH               PROPOFOL ;  Surgeon: Lamar ONEIDA Holmes, MD;  Location: [facility]              ENDOSCOPY;  Service: Endoscopy;  Laterality: N/A; No date: EYE SURGERY No date: ganglion cyst removal; Left No date: POSTERIOR FUSION LUMBAR SPINE     Comment:  x2 No date: TONSILLECTOMY     Reproductive/Obstetrics negative OB ROS                              Anesthesia  Physical Anesthesia Plan  ASA: 3  Anesthesia Plan: General   Post-op Pain Management:    Induction: Intravenous  PONV Risk Score and Plan: Propofol  infusion and TIVA  Airway Management Planned: Natural Airway and Nasal Cannula  Additional Equipment:   Intra-op Plan:   Post-operative Plan:   Informed Consent: I have reviewed the patients History and Physical, chart, labs and discussed the procedure including the risks, benefits and alternatives for the proposed anesthesia with the patient or authorized representative who has indicated his/her understanding and acceptance.     Dental Advisory Given  Plan Discussed with: CRNA  Anesthesia Plan Comments:         Anesthesia Quick Evaluation  "

## 2024-03-17 LAB — SURGICAL PATHOLOGY
